# Patient Record
Sex: Female | Born: 2000 | Race: Black or African American | Hispanic: No | Marital: Single | Smoking: Current every day smoker
Health system: Southern US, Community
[De-identification: ages and names within clinical notes are randomized; demographics above are authoritative.]

## PROBLEM LIST (undated history)

## (undated) DIAGNOSIS — Z789 Other specified health status: Secondary | ICD-10-CM

## (undated) HISTORY — DX: Other specified health status: Z78.9

## (undated) HISTORY — PX: NO PAST SURGERIES: SHX2092

---
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
## 2018-01-10 ENCOUNTER — Encounter (HOSPITAL_COMMUNITY): Payer: Self-pay | Admitting: Emergency Medicine

## 2018-01-10 ENCOUNTER — Other Ambulatory Visit: Payer: Self-pay

## 2018-01-10 ENCOUNTER — Ambulatory Visit (HOSPITAL_COMMUNITY)
Admission: EM | Admit: 2018-01-10 | Discharge: 2018-01-10 | Disposition: A | Discharge status: Home / Self Care | Payer: Medicaid Other | Attending: Family Medicine | Admitting: Family Medicine

## 2018-01-10 DIAGNOSIS — Z79899 Other long term (current) drug therapy: Secondary | ICD-10-CM | POA: Diagnosis not present

## 2018-01-10 DIAGNOSIS — F17210 Nicotine dependence, cigarettes, uncomplicated: Secondary | ICD-10-CM | POA: Insufficient documentation

## 2018-01-10 DIAGNOSIS — N926 Irregular menstruation, unspecified: Secondary | ICD-10-CM | POA: Diagnosis present

## 2018-01-10 LAB — POCT URINALYSIS DIP (DEVICE)
BILIRUBIN URINE: NEGATIVE
GLUCOSE, UA: NEGATIVE mg/dL
KETONES UR: NEGATIVE mg/dL
Nitrite: NEGATIVE
Protein, ur: NEGATIVE mg/dL
Specific Gravity, Urine: 1.025 (ref 1.005–1.030)
Urobilinogen, UA: 0.2 mg/dL (ref 0.0–1.0)
pH: 6 (ref 5.0–8.0)

## 2018-01-10 LAB — POCT PREGNANCY, URINE: Preg Test, Ur: NEGATIVE

## 2018-01-10 NOTE — ED Provider Notes (Signed)
[facility]    CSN: [id number] Arrival date & time: 01/10/18  1023     History   Chief Complaint Chief Complaint  Patient presents with  . Possible Pregnancy    HPI Sarah Bell is a 17 y.o. female.   17 year old female comes in for possible pregnancy.  States that for the past week she has had clear discharge without vaginal itching or pain.  2 days ago she started spotting.  States this is inconsistent with how her cycles are, as she usually has heavy menstrual bleeding.  Therefore, she is worried about pregnancy.  She has some mild cramps to the lower abdomen without nausea or vomiting.  Denies constipation, diarrhea.  Denies fever, chills, night sweats.  Denies urinary symptoms such as frequency, dysuria, hematuria. She is sexually active with one female partner, no condom use.  No other birth control use. States that she was once on oral birth control, discontinued on own halfway through a pack a month or 2 ago.      Past Medical History:  Diagnosis Date  . Medical history non-contributory     Patient Active Problem List   Diagnosis Date Noted  . Asymptomatic bacteriuria in pregnancy in second trimester 02/24/2014  . Susceptible to varicella (non-immune), currently pregnant 01/28/2014  . Supervision of normal first teen pregnancy 01/27/2014    Past Surgical History:  Procedure Laterality Date  . NO PAST SURGERIES      OB History    Gravida Para Term Preterm AB Living   1             SAB TAB Ectopic Multiple Live Births                   Home Medications    Prior to Admission medications   Medication Sig Start Date End Date Taking? Authorizing Provider  ibuprofen (ADVIL,MOTRIN) 600 MG tablet Take 1 tablet (600 mg total) by mouth every 6 (six) hours as needed for mild pain or moderate pain. 04/21/17   Ronnell FreshwaterPatterson, Mallory Honeycutt, NP  ondansetron (ZOFRAN ODT) 4 MG disintegrating tablet Take 1 tablet (4 mg total) by mouth every 8 (eight) hours as  needed for nausea or vomiting. 10/18/16   Gwyneth SproutPlunkett, Whitney, MD  Prenatal Vit-Fe Fumarate-FA (PRENATAL VITAMIN) 27-0.8 MG TABS Take 1 tablet by mouth daily. 02/24/14   Cheral MarkerBooker, Kimberly R, CNM    Family History Family History  Problem Relation Age of Onset  . Hypertension Mother   . Cancer Paternal Aunt        pancreatic  . Diabetes Paternal Grandmother   . Hypertension Paternal Grandmother   . Stroke Paternal Grandfather   . Cancer Other        breast-paternal great grandma  . Kidney disease Other        paternal great grandma    Social History Social History   Tobacco Use  . Smoking status: Current Every Day Smoker    Types: Cigarettes  . Smokeless tobacco: Never Used  Substance Use Topics  . Alcohol use: No  . Drug use: No    Comment: not now     Allergies   Patient has no known allergies.   Review of Systems Review of Systems  Reason unable to perform ROS: See HPI as above.     Physical Exam Triage Vital Signs ED Triage Vitals  Enc Vitals Group     BP 01/10/18 1124 122/64     Pulse Rate 01/10/18 1124 73  Resp 01/10/18 1124 16     Temp 01/10/18 1124 98.9 F (37.2 C)     Temp Source 01/10/18 1124 Oral     SpO2 01/10/18 1124 98 %     Weight --      Height --      Head Circumference --      Peak Flow --      Pain Score 01/10/18 1126 4     Pain Loc --      Pain Edu? --      Excl. in GC? --    No data found.  Updated Vital Signs BP 122/64 (BP Location: Left Arm)   Pulse 73   Temp 98.9 F (37.2 C) (Oral)   Resp 16   LMP 12/13/2017 (Exact Date)   SpO2 98%   Physical Exam  Constitutional: She is oriented to person, place, and time. She appears well-developed and well-nourished. No distress.  HENT:  Head: Normocephalic and atraumatic.  Eyes: Conjunctivae are normal. Pupils are equal, round, and reactive to light.  Cardiovascular: Normal rate, regular rhythm and normal heart sounds. Exam reveals no gallop and no friction rub.  No murmur  heard. Pulmonary/Chest: Effort normal and breath sounds normal. She has no wheezes. She has no rales.  Abdominal: Soft. Bowel sounds are normal. She exhibits no mass. There is no tenderness. There is no rebound, no guarding and no CVA tenderness.  Neurological: She is alert and oriented to person, place, and time.  Skin: Skin is warm and dry.  Psychiatric: She has a normal mood and affect. Her behavior is normal. Judgment normal.     UC Treatments / Results  Labs (all labs ordered are listed, but only abnormal results are displayed) Labs Reviewed  POCT URINALYSIS DIP (DEVICE) - Abnormal; Notable for the following components:      Result Value   Hgb urine dipstick SMALL (*)    Leukocytes, UA SMALL (*)    All other components within normal limits  URINE CULTURE  POCT PREGNANCY, URINE  URINE CYTOLOGY ANCILLARY ONLY    EKG  EKG Interpretation None       Radiology No results found.  Procedures Procedures (including critical care time)  Medications Ordered in UC Medications - No data to display   Initial Impression / Assessment and Plan / UC Course  I have reviewed the triage vital signs and the nursing notes.  Pertinent labs & imaging results that were available during my care of the patient were reviewed by me and considered in my medical decision making (see chart for details).    Urine pregnancy negative.  Will send urine culture and cytology for further evaluation.  Patient has established OB/GYN, will have her follow-up for further evaluation of irregular cycles.  Return precautions given.  Patient expresses understanding and agrees to plan.  Final Clinical Impressions(s) / UC Diagnoses   Final diagnoses:  Irregular menstrual cycle    ED Discharge Orders    None        Belinda Fisher, PA-C 01/10/18 1152

## 2018-01-10 NOTE — ED Triage Notes (Signed)
Pt reports a clear discharge for one week and then two days ago she started "spotting".  She thought it was her period coming early, but she doesn't think that it is.  She is concerned for pregnancy.

## 2018-01-10 NOTE — Discharge Instructions (Signed)
Urine negative for pregnancy today.  As discussed, irregular cycle could be due to recent discontinuation of birth control.  However, there are other factors that could cause spotting.  Follow-up with OB/GYN for further evaluation needed. Testing sent, you will be contacted with any positive results that requires further treatment. Refrain from sexual activity and alcohol use for the next 7 days. Monitor for any worsening of symptoms, fever, abdominal pain, nausea, vomiting, to follow up for reevaluation.

## 2018-01-10 NOTE — ED Notes (Signed)
Urine specimen obtained while patient in lobby Urine in lab

## 2018-01-11 LAB — URINE CYTOLOGY ANCILLARY ONLY
Chlamydia: POSITIVE — AB
Neisseria Gonorrhea: POSITIVE — AB
TRICH (WINDOWPATH): POSITIVE — AB

## 2018-01-11 LAB — URINE CULTURE

## 2018-01-12 ENCOUNTER — Telehealth (HOSPITAL_COMMUNITY): Payer: Self-pay | Admitting: Emergency Medicine

## 2018-01-12 ENCOUNTER — Other Ambulatory Visit: Payer: Self-pay

## 2018-01-12 ENCOUNTER — Ambulatory Visit (HOSPITAL_COMMUNITY)
Admission: EM | Admit: 2018-01-12 | Discharge: 2018-01-12 | Disposition: A | Discharge status: Home / Self Care | Payer: Medicaid Other | Attending: Internal Medicine | Admitting: Internal Medicine

## 2018-01-12 ENCOUNTER — Encounter (HOSPITAL_COMMUNITY): Payer: Self-pay | Admitting: Emergency Medicine

## 2018-01-12 DIAGNOSIS — A549 Gonococcal infection, unspecified: Secondary | ICD-10-CM

## 2018-01-12 DIAGNOSIS — A749 Chlamydial infection, unspecified: Secondary | ICD-10-CM | POA: Diagnosis not present

## 2018-01-12 DIAGNOSIS — A5900 Urogenital trichomoniasis, unspecified: Secondary | ICD-10-CM | POA: Diagnosis not present

## 2018-01-12 LAB — URINE CYTOLOGY ANCILLARY ONLY: Candida vaginitis: NEGATIVE

## 2018-01-12 MED ORDER — AZITHROMYCIN 250 MG PO TABS
ORAL_TABLET | ORAL | Status: AC
  Filled 2018-01-12: qty 4

## 2018-01-12 MED ORDER — CEFTRIAXONE SODIUM 250 MG IJ SOLR
250.0000 mg | Freq: Once | INTRAMUSCULAR | Status: AC
  Administered 2018-01-12: 250 mg via INTRAMUSCULAR

## 2018-01-12 MED ORDER — AZITHROMYCIN 250 MG PO TABS
1000.0000 mg | ORAL_TABLET | Freq: Once | ORAL | Status: AC
  Administered 2018-01-12: 1000 mg via ORAL

## 2018-01-12 MED ORDER — METRONIDAZOLE 500 MG PO TABS: 500.0000 mg | ORAL_TABLET | Freq: Two times a day (BID) | ORAL | 0 refills | Status: DC

## 2018-01-12 MED ORDER — CEFTRIAXONE SODIUM 250 MG IJ SOLR
INTRAMUSCULAR | Status: AC
  Filled 2018-01-12: qty 250

## 2018-01-12 NOTE — ED Triage Notes (Signed)
[facility]. Notes from Dr. Dayton ScrapeMurray in Chart.

## 2018-01-12 NOTE — Telephone Encounter (Signed)
Patient presented for STD treatment.

## 2018-01-12 NOTE — ED Notes (Signed)
Flagyl escribed to patient's pharmacy per Dr. Rennie PlowmanMurray's note.

## 2018-09-29 ENCOUNTER — Emergency Department (HOSPITAL_COMMUNITY): Payer: Medicaid Other

## 2018-09-29 ENCOUNTER — Emergency Department (HOSPITAL_COMMUNITY)
Admission: EM | Admit: 2018-09-29 | Discharge: 2018-09-29 | Disposition: A | Discharge status: Home / Self Care | Payer: Medicaid Other | Attending: Emergency Medicine | Admitting: Emergency Medicine

## 2018-09-29 DIAGNOSIS — N939 Abnormal uterine and vaginal bleeding, unspecified: Secondary | ICD-10-CM

## 2018-09-29 DIAGNOSIS — A599 Trichomoniasis, unspecified: Secondary | ICD-10-CM | POA: Diagnosis not present

## 2018-09-29 DIAGNOSIS — F17210 Nicotine dependence, cigarettes, uncomplicated: Secondary | ICD-10-CM | POA: Diagnosis not present

## 2018-09-29 LAB — URINALYSIS, ROUTINE W REFLEX MICROSCOPIC
BILIRUBIN URINE: NEGATIVE
GLUCOSE, UA: NEGATIVE mg/dL
Ketones, ur: NEGATIVE mg/dL
Nitrite: NEGATIVE
PROTEIN: NEGATIVE mg/dL
SPECIFIC GRAVITY, URINE: 1.025 (ref 1.005–1.030)
pH: 6 (ref 5.0–8.0)

## 2018-09-29 LAB — BASIC METABOLIC PANEL
Anion gap: 7 (ref 5–15)
BUN: 9 mg/dL (ref 6–20)
CO2: 26 mmol/L (ref 22–32)
Calcium: 9.3 mg/dL (ref 8.9–10.3)
Chloride: 104 mmol/L (ref 98–111)
Creatinine, Ser: 0.89 mg/dL (ref 0.44–1.00)
GFR calc Af Amer: >60 mL/min (ref >60)
GFR calc non Af Amer: >60 mL/min (ref >60)
Glucose, Bld: 80 mg/dL (ref 70–99)
POTASSIUM: 3.7 mmol/L (ref 3.5–5.1)
SODIUM: 137 mmol/L (ref 135–145)

## 2018-09-29 LAB — CBC WITH DIFFERENTIAL/PLATELET
Abs Immature Granulocytes: 0.02 10*3/uL (ref 0.00–0.07)
BASOS ABS: 0 10*3/uL (ref 0.0–0.1)
Basophils Relative: 0 %
Eosinophils Absolute: 0.2 10*3/uL (ref 0.0–0.5)
Eosinophils Relative: 3 %
HCT: 42.3 % (ref 36.0–46.0)
HEMOGLOBIN: 13.6 g/dL (ref 12.0–15.0)
Immature Granulocytes: 0 %
LYMPHS PCT: 61 %
Lymphs Abs: 3.8 10*3/uL (ref 0.7–4.0)
MCH: 29.2 pg (ref 26.0–34.0)
MCHC: 32.2 g/dL (ref 30.0–36.0)
MCV: 90.8 fL (ref 80.0–100.0)
Monocytes Absolute: 0.6 10*3/uL (ref 0.1–1.0)
Monocytes Relative: 10 %
NEUTROS ABS: 1.6 10*3/uL — AB (ref 1.7–7.7)
Neutrophils Relative %: 26 %
Platelets: 297 10*3/uL (ref 150–400)
RBC: 4.66 MIL/uL (ref 3.87–5.11)
RDW: 12 % (ref 11.5–15.5)
WBC: 6.2 10*3/uL (ref 4.0–10.5)
nRBC: 0 % (ref 0.0–0.2)

## 2018-09-29 LAB — WET PREP, GENITAL
CLUE CELLS WET PREP: NONE SEEN
Sperm: NONE SEEN
Yeast Wet Prep HPF POC: NONE SEEN

## 2018-09-29 LAB — POC URINE PREG, ED: Preg Test, Ur: NEGATIVE

## 2018-09-29 MED ORDER — METRONIDAZOLE 500 MG PO TABS
2000.0000 mg | ORAL_TABLET | Freq: Once | ORAL | Status: AC
  Administered 2018-09-29: 2000 mg via ORAL
  Filled 2018-09-29: qty 4

## 2018-09-29 MED ORDER — ONDANSETRON 4 MG PO TBDP
4.0000 mg | ORAL_TABLET | Freq: Once | ORAL | Status: AC
  Administered 2018-09-29: 4 mg via ORAL
  Filled 2018-09-29: qty 1

## 2018-09-29 NOTE — ED Triage Notes (Addendum)
Pt c/o vaginal bleeding x 3 days, spotting, progressing to clots; pt states it does not feel like her period; c/o abdominal cramping; LMP 11/23; denies dysuria, frequency; endorses some nausea, denies vomiting

## 2018-09-29 NOTE — ED Notes (Signed)
Discharge papers given to patient.  Patient verbalized understanding.

## 2018-09-29 NOTE — Discharge Instructions (Addendum)
You are treated today for trichomonas.  This is a one-time treatment.  You do not need further treatment.  Please do not have any unprotected sexual intercourse for 5 to 7 days while this medication is working.  Your partner will need to be treated.  Use a condom with every sexual encounter Follow up with your doctor OBGYN in regards to today's visit and your irregular vaginal bleeding. Please return to the ER for worsening symptoms, high fevers or persistent vomiting.  You have been tested for chlamydia and gonorrhea. These results will be available in approximately 3 days. You will be notified if they are positive.   It is very important to practice safe sex and use condoms when sexually active. If your results are positive you need to notify all sexual partners so they can be treated as well. The website https://garcia.net/http://www.dontspreadit.com/ can be used to send anonymous text messages or emails to alert sexual contacts.   SEEK IMMEDIATE MEDICAL CARE IF:  You develop an oral temperature above 102 F (38.9 C), not controlled by medications or lasting more than 2 days.  You develop an increase in pain.  You develop vaginal bleeding and it is not time for your period.  You develop painful intercourse.

## 2018-09-29 NOTE — ED Notes (Signed)
Pelvic cart setup bedside. 

## 2018-09-30 NOTE — ED Provider Notes (Signed)
[facility] EMERGENCY DEPARTMENT Provider Note   CSN: [id number] Arrival date & time: 09/29/18  1240     History   Chief Complaint No chief complaint on file.   HPI Sarah Bell is a 17 y.o. female.  HPI  Patient is a 17 year old female with no significant past medical history presenting for intermenstrual vaginal bleeding and cramping.  Patient reports that she had a last menstrual period around 09-12-2018, and began again spotting 3 days ago.  She reports this is progressed to clots today.  She denies soaking through pads rapidly.  She reports that she changes her pad every time she goes the bathroom, so she does not know how many she has soaked through.  Patient also reports a small amount of vaginal discharge slightly increased above her baseline prior to the onset of bleeding.  Patient reports that she feels intermittent lower abdominal cramping without focal nature that radiates into the back.  Patient denies dysuria, urgency, or frequency.  Patient is sexually active with one female partner within the last 2 months.  Patient denies concerns about STI exposure.  Patient is not using contraception or protection, denies trying to become pregnant.  Past Medical History:  Diagnosis Date  . Medical history non-contributory     Patient Active Problem List   Diagnosis Date Noted  . Asymptomatic bacteriuria in pregnancy in second trimester 02/24/2014  . Susceptible to varicella (non-immune), currently pregnant 01/28/2014  . Supervision of normal first teen pregnancy 01/27/2014    Past Surgical History:  Procedure Laterality Date  . NO PAST SURGERIES       OB History    Gravida  1   Para      Term      Preterm      AB      Living        SAB      TAB      Ectopic      Multiple      Live Births               Home Medications    Prior to Admission medications   Medication Sig Start Date End Date Taking? Authorizing Provider    ibuprofen (ADVIL,MOTRIN) 600 MG tablet Take 1 tablet (600 mg total) by mouth every 6 (six) hours as needed for mild pain or moderate pain. Patient not taking: Reported on 09/29/2018 04/21/17   Ronnell Freshwater, NP  metroNIDAZOLE (FLAGYL) 500 MG tablet Take 1 tablet (500 mg total) by mouth 2 (two) times daily. Patient not taking: Reported on 09/29/2018 01/12/18   Isa Rankin, MD  ondansetron (ZOFRAN ODT) 4 MG disintegrating tablet Take 1 tablet (4 mg total) by mouth every 8 (eight) hours as needed for nausea or vomiting. Patient not taking: Reported on 09/29/2018 10/18/16   Gwyneth Sprout, MD  Prenatal Vit-Fe Fumarate-FA (PRENATAL VITAMIN) 27-0.8 MG TABS Take 1 tablet by mouth daily. Patient not taking: Reported on 09/29/2018 02/24/14   Cheral Marker, CNM    Family History Family History  Problem Relation Age of Onset  . Hypertension Mother   . Cancer Paternal Aunt        pancreatic  . Diabetes Paternal Grandmother   . Hypertension Paternal Grandmother   . Stroke Paternal Grandfather   . Cancer Other        breast-paternal great grandma  . Kidney disease Other        paternal great grandma    Social  History Social History   Tobacco Use  . Smoking status: Current Every Day Smoker    Types: Cigarettes  . Smokeless tobacco: Never Used  Substance Use Topics  . Alcohol use: No  . Drug use: No    Types: Marijuana    Comment: not now     Allergies   Patient has no known allergies.   Review of Systems Review of Systems  Constitutional: Negative for chills and fever.  HENT: Negative for congestion and sore throat.   Eyes: Negative for visual disturbance.  Respiratory: Negative for cough, chest tightness and shortness of breath.   Cardiovascular: Negative for chest pain, palpitations and leg swelling.  Gastrointestinal: Negative for abdominal pain, nausea and vomiting.  Genitourinary: Positive for pelvic pain, vaginal bleeding and vaginal discharge.  Negative for dysuria, flank pain and frequency.  Musculoskeletal: Negative for back pain and myalgias.  Skin: Negative for rash.  Neurological: Negative for dizziness, syncope, light-headedness and headaches.     Physical Exam Updated Vital Signs BP 130/71   Pulse 62   Temp 99.4 F (37.4 C) (Oral)   Resp 16   Ht 5\' 2"  (1.575 m)   Wt 85.7 kg   SpO2 100%   BMI 34.57 kg/m   Physical Exam  Constitutional: She appears well-developed and well-nourished. No distress.  HENT:  Head: Normocephalic and atraumatic.  Mouth/Throat: Oropharynx is clear and moist.  Eyes: Pupils are equal, round, and reactive to light. Conjunctivae and EOM are normal.  Neck: Normal range of motion. Neck supple.  Cardiovascular: Normal rate, regular rhythm, S1 normal and S2 normal.  No murmur heard. Pulmonary/Chest: Effort normal and breath sounds normal. She has no wheezes. She has no rales.  Abdominal: Soft. She exhibits no distension. There is no tenderness. There is no guarding.  Genitourinary:  Genitourinary Comments: Examination performed with EMT chaperone present.  Patient has no lymphadenopathy vagina or perineum.  Vaginal tissue is pink and rugated.  Cervix is not erythematous and nonfriable.  Few clots present within the vaginal vault.  No vaginal discharge appreciable around cervical os.  On bimanual exam, patient exhibits no cervical motion tenderness, tenderness to uterine fundus, nor adnexal tenderness.  Musculoskeletal: Normal range of motion. She exhibits no edema or deformity.  Neurological: She is alert.  Cranial nerves grossly intact. Patient moves extremities symmetrically and with good coordination.  Skin: Skin is warm and dry. No rash noted. No erythema.  Psychiatric: She has a normal mood and affect. Her behavior is normal. Judgment and thought content normal.  Nursing note and vitals reviewed.    ED Treatments / Results  Labs (all labs ordered are listed, but only abnormal results  are displayed) Labs Reviewed  WET PREP, GENITAL - Abnormal; Notable for the following components:      Result Value   Trich, Wet Prep PRESENT (*)    WBC, Wet Prep HPF POC FEW (*)    All other components within normal limits  CBC WITH DIFFERENTIAL/PLATELET - Abnormal; Notable for the following components:   Neutro Abs 1.6 (*)    All other components within normal limits  URINALYSIS, ROUTINE W REFLEX MICROSCOPIC - Abnormal; Notable for the following components:   Hgb urine dipstick LARGE (*)    Leukocytes, UA TRACE (*)    Bacteria, UA RARE (*)    All other components within normal limits  BASIC METABOLIC PANEL  POC URINE PREG, ED  GC/CHLAMYDIA PROBE AMP ([place]) [facility]    EKG None  Radiology US Transvaginal Non-ob  Result Date: 09/29/2018 CLINICAL DATA:  Pelvic pain and vaginal bleeding for 3 days. EXAM: TRANSABDOMINAL AND TRANSVAGINAL ULTRASOUND OF PELVIS DOPPLER ULTRASOUND OF OVARIES TECHNIQUE: Both transabdominal and transvaginal ultrasound examinations of the pelvis were performed. Transabdominal technique was performed for global imaging of the pelvis including uterus, ovaries, adnexal regions, and pelvic cul-de-sac. It was necessary to proceed with endovaginal exam following the transabdominal exam to visualize the endometrium and ovaries. Color and duplex Doppler ultrasound was utilized to evaluate blood flow to the ovaries. COMPARISON:  None. FINDINGS: Uterus Measurements: 8.6 x 4.0 x 3.7 cm = volume: 66 mL. No fibroids or other mass visualized. Endometrium Thickness: 6 mm.  No focal abnormality visualized. Right ovary Measurements: 2.5 x 1.7 x 2.1 cm = volume: 4.8 mL. Normal appearance/no adnexal mass. Left ovary Measurements: 6.0 x 4.9 x 5.2 cm = volume: 79.8 mL. A simple cyst is seen which measures 5.3 x 4.4 x 4.5 cm, and has benign characteristics. Pulsed Doppler evaluation of both ovaries demonstrates normal low-resistance arterial and venous waveforms. Other findings  No abnormal free fluid. IMPRESSION: 5.3 cm benign-appearing left ovarian cyst. No imaging follow up is required for premenopausal females. This follows consensus guidelines: Simple Adnexal Cysts: SRU Consensus Conference Update on Follow-up and Reporting. Radiology 2019; [id number]. Normal appearance of uterus and right ovary. No sonographic evidence for ovarian torsion. Electronically Signed   By: Myles Rosenthal M.D.   On: 09/29/2018 17:23   US Pelvis Complete  Result Date: 09/29/2018 CLINICAL DATA:  Pelvic pain and vaginal bleeding for 3 days. EXAM: TRANSABDOMINAL AND TRANSVAGINAL ULTRASOUND OF PELVIS DOPPLER ULTRASOUND OF OVARIES TECHNIQUE: Both transabdominal and transvaginal ultrasound examinations of the pelvis were performed. Transabdominal technique was performed for global imaging of the pelvis including uterus, ovaries, adnexal regions, and pelvic cul-de-sac. It was necessary to proceed with endovaginal exam following the transabdominal exam to visualize the endometrium and ovaries. Color and duplex Doppler ultrasound was utilized to evaluate blood flow to the ovaries. COMPARISON:  None. FINDINGS: Uterus Measurements: 8.6 x 4.0 x 3.7 cm = volume: 66 mL. No fibroids or other mass visualized. Endometrium Thickness: 6 mm.  No focal abnormality visualized. Right ovary Measurements: 2.5 x 1.7 x 2.1 cm = volume: 4.8 mL. Normal appearance/no adnexal mass. Left ovary Measurements: 6.0 x 4.9 x 5.2 cm = volume: 79.8 mL. A simple cyst is seen which measures 5.3 x 4.4 x 4.5 cm, and has benign characteristics. Pulsed Doppler evaluation of both ovaries demonstrates normal low-resistance arterial and venous waveforms. Other findings No abnormal free fluid. IMPRESSION: 5.3 cm benign-appearing left ovarian cyst. No imaging follow up is required for premenopausal females. This follows consensus guidelines: Simple Adnexal Cysts: SRU Consensus Conference Update on Follow-up and Reporting. Radiology 2019; [id number]. Normal  appearance of uterus and right ovary. No sonographic evidence for ovarian torsion. Electronically Signed   By: Myles Rosenthal M.D.   On: 09/29/2018 17:23   [place] Art/ven Flow Abd Pelv Doppler  Result Date: 09/29/2018 CLINICAL DATA:  Pelvic pain and vaginal bleeding for 3 days. EXAM: TRANSABDOMINAL AND TRANSVAGINAL ULTRASOUND OF PELVIS DOPPLER ULTRASOUND OF OVARIES TECHNIQUE: Both transabdominal and transvaginal ultrasound examinations of the pelvis were performed. Transabdominal technique was performed for global imaging of the pelvis including uterus, ovaries, adnexal regions, and pelvic cul-de-sac. It was necessary to proceed with endovaginal exam following the transabdominal exam to visualize the endometrium and ovaries. Color and duplex Doppler ultrasound was utilized to evaluate blood flow to the ovaries. COMPARISON:  None. FINDINGS: Uterus Measurements: 8.6 x 4.0 x 3.7 cm = volume: 66 mL. No fibroids or other mass visualized. Endometrium Thickness: 6 mm.  No focal abnormality visualized. Right ovary Measurements: 2.5 x 1.7 x 2.1 cm = volume: 4.8 mL. Normal appearance/no adnexal mass. Left ovary Measurements: 6.0 x 4.9 x 5.2 cm = volume: 79.8 mL. A simple cyst is seen which measures 5.3 x 4.4 x 4.5 cm, and has benign characteristics. Pulsed Doppler evaluation of both ovaries demonstrates normal low-resistance arterial and venous waveforms. Other findings No abnormal free fluid. IMPRESSION: 5.3 cm benign-appearing left ovarian cyst. No imaging follow up is required for premenopausal females. This follows consensus guidelines: Simple Adnexal Cysts: SRU Consensus Conference Update on Follow-up and Reporting. Radiology 2019; [id number]. Normal appearance of uterus and right ovary. No sonographic evidence for ovarian torsion. Electronically Signed   By: Myles Rosenthal M.D.   On: 09/29/2018 17:23    Procedures Procedures (including critical care time)  Medications Ordered in ED Medications  metroNIDAZOLE (FLAGYL)  tablet 2,000 mg (2,000 mg Oral Given 09/29/18 1818)  ondansetron (ZOFRAN-ODT) disintegrating tablet 4 mg (4 mg Oral Given 09/29/18 1819)     Initial Impression / Assessment and Plan / ED Course  I have reviewed the triage vital signs and the nursing notes.  Pertinent labs & imaging results that were available during my care of the patient were reviewed by me and considered in my medical decision making (see chart for details).     Patient is nontoxic-appearing, afebrile, with benign abdomen.  Pelvic examination not concerning for PID.  Patient not having abdominal tenderness during emergency department course.  Doubt PID.  Differential diagnosis includes abnormal uterine bleeding, ectopic pregnancy, spontaneous abortion.  Patient is not pregnant.  Os is closed on exam.  Do not suspect miscarriage.  Patient's blood type is B+, therefore patient had recent miscarriage, patient does not need RhoGam.  Pelvic ultrasound demonstrates no intrauterine abnormalities.  It demonstrates a left ovarian cyst, for which no follow-up is recommended.  Wet prep demonstrates trichomonas and few WBCs.  Based on pelvic exam, do not suspect further infection.  Will treat for trichomonas.  Patient instructed to follow-up with OB/GYN for abnormal uterine bleeding.  Return precautions were given for any rapid increase in bleeding, pelvic pain, or symptoms of fever or chills.  Patient is in understanding and agrees with the plan of care.  Final Clinical Impressions(s) / ED Diagnoses   Final diagnoses:  Vaginal bleeding  Trichomonas infection    ED Discharge Orders    None       Delia Chimes 09/30/18 0021    Tegeler, Canary Brim, MD 09/30/18 [phone]

## 2018-10-01 LAB — GC/CHLAMYDIA PROBE AMP (~~LOC~~) NOT AT ARMC
CHLAMYDIA, DNA PROBE: NEGATIVE
NEISSERIA GONORRHEA: NEGATIVE

## 2019-06-24 ENCOUNTER — Ambulatory Visit (HOSPITAL_COMMUNITY)
Admission: EM | Admit: 2019-06-24 | Discharge: 2019-06-24 | Disposition: A | Discharge status: Home / Self Care | Payer: Medicaid Other | Attending: Internal Medicine | Admitting: Internal Medicine

## 2019-06-24 ENCOUNTER — Encounter (HOSPITAL_COMMUNITY): Payer: Self-pay | Admitting: Emergency Medicine

## 2019-06-24 ENCOUNTER — Other Ambulatory Visit: Payer: Self-pay

## 2019-06-24 DIAGNOSIS — Z3202 Encounter for pregnancy test, result negative: Secondary | ICD-10-CM | POA: Diagnosis not present

## 2019-06-24 DIAGNOSIS — N926 Irregular menstruation, unspecified: Secondary | ICD-10-CM | POA: Insufficient documentation

## 2019-06-24 DIAGNOSIS — Z113 Encounter for screening for infections with a predominantly sexual mode of transmission: Secondary | ICD-10-CM | POA: Diagnosis not present

## 2019-06-24 LAB — POCT URINALYSIS DIP (DEVICE)
Glucose, UA: NEGATIVE mg/dL
Hgb urine dipstick: NEGATIVE
Ketones, ur: NEGATIVE mg/dL
Leukocytes,Ua: NEGATIVE
Nitrite: NEGATIVE
Protein, ur: NEGATIVE mg/dL
Specific Gravity, Urine: >=1.03 (ref 1.005–1.030)
Urobilinogen, UA: 1 mg/dL (ref 0.0–1.0)
pH: 7 (ref 5.0–8.0)

## 2019-06-24 LAB — POCT PREGNANCY, URINE: Preg Test, Ur: NEGATIVE

## 2019-06-24 NOTE — ED Provider Notes (Signed)
[facility]    CSN: [id number] Arrival date & time: 06/24/19  0831      History   Chief Complaint Chief Complaint  Patient presents with  . Menstrual Problem    HPI Sarah Bell is a 18 y.o. female.   Patient is a 18 year old female with no significant past medical history.  She presents today for irregular vaginal bleeding, spotting.  Reporting that typically her menstrual cycles come towards the end of every month but this last menstrual started on D 17th which was early.  She says it was approximately 3 days of spotting which is abnormal for her.  She is also had increase in vaginal discharge.  Denies any vaginal odor, itching or irritation.  No dysuria, hematuria or urinary frequency.  She is not currently using any birth control and is currently sexually active unprotected.  Treated for trichomonas back in March.  No abdominal pain, back pain, fevers.  ROS per HPI      Past Medical History:  Diagnosis Date  . Medical history non-contributory     Patient Active Problem List   Diagnosis Date Noted  . Asymptomatic bacteriuria in pregnancy in second trimester 02/24/2014  . Susceptible to varicella (non-immune), currently pregnant 01/28/2014  . Supervision of normal first teen pregnancy 01/27/2014    Past Surgical History:  Procedure Laterality Date  . NO PAST SURGERIES      OB History    Gravida  1   Para      Term      Preterm      AB      Living        SAB      TAB      Ectopic      Multiple      Live Births               Home Medications    Prior to Admission medications   Not on File    Family History Family History  Problem Relation Age of Onset  . Hypertension Mother   . Cancer Paternal Aunt        pancreatic  . Diabetes Paternal Grandmother   . Hypertension Paternal Grandmother   . Stroke Paternal Grandfather   . Cancer Other        breast-paternal great grandma  . Kidney disease Other        paternal  great grandma    Social History Social History   Tobacco Use  . Smoking status: Current Every Day Smoker    Types: Cigarettes  . Smokeless tobacco: Never Used  Substance Use Topics  . Alcohol use: No  . Drug use: No    Types: Marijuana    Comment: not now     Allergies   Patient has no known allergies.   Review of Systems Review of Systems   Physical Exam Triage Vital Signs ED Triage Vitals  Enc Vitals Group     BP 06/24/19 0918 131/79     Pulse Rate 06/24/19 0918 88     Resp 06/24/19 0918 18     Temp 06/24/19 0918 98.4 F (36.9 C)     Temp Source 06/24/19 0918 Oral     SpO2 06/24/19 0918 96 %     Weight --      Height --      Head Circumference --      Peak Flow --      Pain Score 06/24/19 0915 0  Pain Loc --      Pain Edu? --      Excl. in [place]? --    No data found.  Updated Vital Signs BP 131/79 (BP Location: Right Arm)   Pulse 88   Temp 98.4 F (36.9 C) (Oral)   Resp 18   LMP 06/10/2019   SpO2 96%   Visual Acuity Right Eye Distance:   Left Eye Distance:   Bilateral Distance:    Right Eye Near:   Left Eye Near:    Bilateral Near:     Physical Exam Vitals signs and nursing note reviewed.  Constitutional:      General: She is not in acute distress.    Appearance: Normal appearance. She is not ill-appearing, toxic-appearing or diaphoretic.  HENT:     Head: Normocephalic.     Nose: Nose normal.     Mouth/Throat:     Pharynx: Oropharynx is clear.  Eyes:     Conjunctiva/sclera: Conjunctivae normal.  Neck:     Musculoskeletal: Normal range of motion.  Pulmonary:     Effort: Pulmonary effort is normal.  Abdominal:     Palpations: Abdomen is soft.     Tenderness: There is no abdominal tenderness.  Musculoskeletal: Normal range of motion.  Skin:    General: Skin is warm and dry.     Findings: No rash.  Neurological:     Mental Status: She is alert.  Psychiatric:        Mood and Affect: Mood normal.      UC Treatments / Results   Labs (all labs ordered are listed, but only abnormal results are displayed) Labs Reviewed  POCT URINALYSIS DIP (DEVICE) - Abnormal; Notable for the following components:      Result Value   Bilirubin Urine SMALL (*)    All other components within normal limits  POCT PREGNANCY, URINE  CERVICOVAGINAL ANCILLARY ONLY    EKG   Radiology No results found.  Procedures Procedures (including critical care time)  Medications Ordered in UC Medications - No data to display  Initial Impression / Assessment and Plan / UC Course  I have reviewed the triage vital signs and the nursing notes.  Pertinent labs & imaging results that were available during my care of the patient were reviewed by me and considered in my medical decision making (see chart for details).     Urine negative for pregnancy and infection. Sending swab for STD testing with labs pending. Recommended follow-up with OB/GYN for further management of her menstrual cycles. Final Clinical Impressions(s) / UC Diagnoses   Final diagnoses:  Irregular menstrual cycle     Discharge Instructions     Your pregnancy test was negative Urine did not show any infection. We will send the swab for testing and call you with any positive results. Recommend following up with OB/GYN for regulation of [place] cycles.    ED Prescriptions    None     Controlled Substance Prescriptions [place] Controlled Substance Registry consulted? Not Applicable   Orvan July, NP 06/24/19 [phone]

## 2019-06-24 NOTE — ED Triage Notes (Addendum)
Complains of irregular periods.  Last month started a week early.   Patient has not been on birth control of any kind for the last 2 years.  Started having periods again last year after stopping depo and a brief round of bcp.    denies pain, but later, admits to crampy feeling with spotty period she had in august

## 2019-06-24 NOTE — Discharge Instructions (Addendum)
Your pregnancy test was negative Urine did not show any infection. We will send the swab for testing and call you with any positive results. Recommend following up with OB/GYN for regulation of Mitchell cycles.

## 2019-06-24 NOTE — ED Notes (Signed)
Sent patient for specimen

## 2019-06-24 NOTE — ED Notes (Signed)
Urine in lab 

## 2019-06-26 ENCOUNTER — Encounter (HOSPITAL_COMMUNITY): Payer: Self-pay

## 2019-06-26 ENCOUNTER — Telehealth (HOSPITAL_COMMUNITY): Payer: Self-pay | Admitting: Emergency Medicine

## 2019-06-26 LAB — CERVICOVAGINAL ANCILLARY ONLY
Bacterial vaginitis: POSITIVE — AB
Candida vaginitis: NEGATIVE
Chlamydia: POSITIVE — AB
Neisseria Gonorrhea: NEGATIVE
Trichomonas: NEGATIVE

## 2019-06-26 MED ORDER — AZITHROMYCIN 250 MG PO TABS: 1000.0000 mg | ORAL_TABLET | Freq: Once | ORAL | 0 refills | Status: AC

## 2019-06-26 MED ORDER — METRONIDAZOLE 500 MG PO TABS: 500.0000 mg | ORAL_TABLET | Freq: Two times a day (BID) | ORAL | 0 refills | Status: AC

## 2019-06-26 NOTE — Telephone Encounter (Signed)
Chlamydia is positive.  Rx po zithromax 1g #1 dose no refills was sent to the pharmacy of record.  Pt needs education to please refrain from sexual intercourse for 7 days to give the medicine time to work, sexual partners need to be notified and tested/treated.  Condoms may reduce risk of reinfection.  Recheck or followup with PCP for further evaluation if symptoms are not improving.   [facility] notified.  Bacterial vaginosis is positive. This was not treated at the urgent care visit.  Flagyl 500 mg BID x 7 days #14 no refills sent to patients pharmacy of choice.    Attempted to reach patient. No answer at this time. Call cannot be completed. Mychart message sent.

## 2019-06-27 ENCOUNTER — Telehealth (HOSPITAL_COMMUNITY): Payer: Self-pay | Admitting: Emergency Medicine

## 2019-06-27 NOTE — Telephone Encounter (Signed)
Attempted to reach patient x2. No answer at this time. Call cannot be completed. Will send letter, try once more.

## 2019-07-01 ENCOUNTER — Telehealth (HOSPITAL_COMMUNITY): Payer: Self-pay | Admitting: Emergency Medicine

## 2019-07-01 ENCOUNTER — Encounter (HOSPITAL_COMMUNITY): Payer: Self-pay

## 2019-07-01 NOTE — Telephone Encounter (Signed)
Attempted to reach patient x3. No answer at this time.Call cannot be completed. Letter already sent, however pt did read mychart message. Sent her another message to confirm she picked up the medication. Will close out chart.

## 2019-10-15 ENCOUNTER — Encounter (HOSPITAL_COMMUNITY): Payer: Self-pay

## 2019-10-15 ENCOUNTER — Ambulatory Visit (HOSPITAL_COMMUNITY)
Admission: EM | Admit: 2019-10-15 | Discharge: 2019-10-15 | Disposition: A | Discharge status: Home / Self Care | Payer: Medicaid Other | Attending: Family Medicine | Admitting: Family Medicine

## 2019-10-15 ENCOUNTER — Other Ambulatory Visit: Payer: Self-pay

## 2019-10-15 DIAGNOSIS — Z113 Encounter for screening for infections with a predominantly sexual mode of transmission: Secondary | ICD-10-CM | POA: Insufficient documentation

## 2019-10-15 LAB — POCT URINALYSIS DIP (DEVICE)
Glucose, UA: NEGATIVE mg/dL
Hgb urine dipstick: NEGATIVE
Leukocytes,Ua: NEGATIVE
Nitrite: NEGATIVE
Protein, ur: NEGATIVE mg/dL
Specific Gravity, Urine: >=1.03 (ref 1.005–1.030)
Urobilinogen, UA: 0.2 mg/dL (ref 0.0–1.0)
pH: 6 (ref 5.0–8.0)

## 2019-10-15 LAB — RAPID HIV SCREEN (HIV 1/2 AB+AG)
HIV 1/2 Antibodies: NONREACTIVE
HIV-1 P24 Antigen - HIV24: NONREACTIVE

## 2019-10-15 NOTE — Discharge Instructions (Addendum)
Screening you for STDs We will call with any positive results.
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
## 2019-10-16 LAB — RPR: RPR Ser Ql: NONREACTIVE

## 2019-10-16 NOTE — ED Provider Notes (Signed)
[facility]    CSN: [id number] Arrival date & time: 10/15/19  1207      History   Chief Complaint Chief Complaint  Patient presents with  . S.74    HPI Sarah Bell is a 18 y.o. female.   Patient is a 18 year old female the presents today for STD screening.  Reporting new partner and would like to be screened.  Currently denies any symptoms.Patient's last menstrual period was 10/06/2019.      Past Medical History:  Diagnosis Date  . Medical history non-contributory     Patient Active Problem List   Diagnosis Date Noted  . Asymptomatic bacteriuria in pregnancy in second trimester 02/24/2014  . Susceptible to varicella (non-immune), currently pregnant 01/28/2014  . Supervision of normal first teen pregnancy 01/27/2014    Past Surgical History:  Procedure Laterality Date  . NO PAST SURGERIES      OB History    Gravida  1   Para      Term      Preterm      AB      Living        SAB      TAB      Ectopic      Multiple      Live Births               Home Medications    Prior to Admission medications   Not on File    Family History Family History  Problem Relation Age of Onset  . Hypertension Mother   . Cancer Paternal Aunt        pancreatic  . Diabetes Paternal Grandmother   . Hypertension Paternal Grandmother   . Stroke Paternal Grandfather   . Cancer Other        breast-paternal great grandma  . Kidney disease Other        paternal great grandma  . Healthy Father     Social History Social History   Tobacco Use  . Smoking status: Current Every Day Smoker    Types: Cigarettes  . Smokeless tobacco: Never Used  Substance Use Topics  . Alcohol use: No  . Drug use: No    Types: Marijuana    Comment: not now     Allergies   Patient has no known allergies.   Review of Systems Review of Systems  Genitourinary: Negative for decreased urine volume, difficulty urinating, dyspareunia, dysuria, enuresis,  flank pain, frequency, genital sores, hematuria, menstrual problem, pelvic pain, urgency, vaginal bleeding, vaginal discharge and vaginal pain.     Physical Exam Triage Vital Signs ED Triage Vitals [10/15/19 1237]  Enc Vitals Group     BP 134/83     Pulse Rate 74     Resp 18     Temp 98.5 F (36.9 C)     Temp Source Oral     SpO2 92 %     Weight 176 lb 9.6 oz (80.1 kg)     Height      Head Circumference      Peak Flow      Pain Score 0     Pain Loc      Pain Edu?      Excl. in [place]?    No data found.  Updated Vital Signs BP 134/83 (BP Location: Right Arm)   Pulse 74   Temp 98.5 F (36.9 C) (Oral)   Resp 18   Wt 176 lb 9.6 oz (80.1 kg)  LMP 10/06/2019   SpO2 92%   BMI 32.30 kg/m   Visual Acuity Right Eye Distance:   Left Eye Distance:   Bilateral Distance:    Right Eye Near:   Left Eye Near:    Bilateral Near:     Physical Exam Vitals and nursing note reviewed.  Constitutional:      General: She is not in acute distress.    Appearance: Normal appearance. She is not ill-appearing, toxic-appearing or diaphoretic.  HENT:     Head: Normocephalic.     Nose: Nose normal.     Mouth/Throat:     Pharynx: Oropharynx is clear.  Eyes:     Conjunctiva/sclera: Conjunctivae normal.  Pulmonary:     Effort: Pulmonary effort is normal.  Abdominal:     Palpations: Abdomen is soft.     Tenderness: There is no abdominal tenderness.  Musculoskeletal:        General: Normal range of motion.     Cervical back: Normal range of motion.  Skin:    General: Skin is warm and dry.     Findings: No rash.  Neurological:     Mental Status: She is alert.  Psychiatric:        Mood and Affect: Mood normal.      UC Treatments / Results  Labs (all labs ordered are listed, but only abnormal results are displayed) Labs Reviewed  POCT URINALYSIS DIP (DEVICE) - Abnormal; Notable for the following components:      Result Value   Bilirubin Urine SMALL (*)    Ketones, ur TRACE  (*)    All other components within normal limits  RAPID HIV SCREEN (HIV 1/2 AB+AG)  RPR  CERVICOVAGINAL ANCILLARY ONLY    EKG   Radiology No results found.  Procedures Procedures (including critical care time)  Medications Ordered in UC Medications - No data to display  Initial Impression / Assessment and Plan / UC Course  I have reviewed the triage vital signs and the nursing notes.  Pertinent labs & imaging results that were available during my care of the patient were reviewed by me and considered in my medical decision making (see chart for details).     STD screening- swab sent for testing Pt not wanting treatment   Final Clinical Impressions(s) / UC Diagnoses   Final diagnoses:  Screening examination for STD (sexually transmitted disease)     Discharge Instructions     Screening you for STDs We will call with any positive results.     ED Prescriptions    None     PDMP not reviewed this encounter.   Janace Aris, NP 10/16/19 1009

## 2019-10-17 ENCOUNTER — Telehealth: Payer: Self-pay | Admitting: Emergency Medicine

## 2019-10-17 LAB — CERVICOVAGINAL ANCILLARY ONLY
Bacterial vaginitis: POSITIVE — AB
Candida vaginitis: NEGATIVE
Chlamydia: NEGATIVE
Neisseria Gonorrhea: NEGATIVE
Trichomonas: POSITIVE — AB

## 2019-10-17 MED ORDER — METRONIDAZOLE 500 MG PO TABS: 500.0000 mg | ORAL_TABLET | Freq: Two times a day (BID) | ORAL | 0 refills | Status: AC

## 2019-10-17 NOTE — Telephone Encounter (Signed)
Bacterial vaginosis is positive. This was not treated at the urgent care visit.  Flagyl 500 mg BID x 7 days #14 no refills sent to patients pharmacy of choice.    Trichomonas is positive. Rx  for Flagyl once was sent to the pharmacy of record. Pt needs education to refrain from sexual intercourse for 7 days to give the medicine time to work. Sexual partners need to be notified and tested/treated. Condoms may reduce risk of reinfection. Recheck for further evaluation if symptoms are not improving.   Patient contacted by phone and made aware of    results. Pt verbalized understanding and had all questions answered.

## 2020-01-17 ENCOUNTER — Ambulatory Visit (HOSPITAL_COMMUNITY)
Admission: EM | Admit: 2020-01-17 | Discharge: 2020-01-17 | Disposition: A | Discharge status: Home / Self Care | Payer: Medicaid Other | Attending: Urgent Care | Admitting: Urgent Care

## 2020-01-17 ENCOUNTER — Other Ambulatory Visit: Payer: Self-pay

## 2020-01-17 ENCOUNTER — Encounter (HOSPITAL_COMMUNITY): Payer: Self-pay

## 2020-01-17 DIAGNOSIS — Z202 Contact with and (suspected) exposure to infections with a predominantly sexual mode of transmission: Secondary | ICD-10-CM

## 2020-01-17 DIAGNOSIS — Z3202 Encounter for pregnancy test, result negative: Secondary | ICD-10-CM | POA: Diagnosis not present

## 2020-01-17 DIAGNOSIS — N898 Other specified noninflammatory disorders of vagina: Secondary | ICD-10-CM | POA: Insufficient documentation

## 2020-01-17 LAB — POCT PREGNANCY, URINE: Preg Test, Ur: NEGATIVE

## 2020-01-17 LAB — POC URINE PREG, ED: Preg Test, Ur: NEGATIVE

## 2020-01-17 MED ORDER — AZITHROMYCIN 250 MG PO TABS
ORAL_TABLET | ORAL | Status: AC
  Filled 2020-01-17: qty 4

## 2020-01-17 MED ORDER — CEFTRIAXONE SODIUM 500 MG IJ SOLR
500.0000 mg | Freq: Once | INTRAMUSCULAR | Status: AC
  Administered 2020-01-17: 500 mg via INTRAMUSCULAR

## 2020-01-17 MED ORDER — AZITHROMYCIN 250 MG PO TABS
1000.0000 mg | ORAL_TABLET | Freq: Once | ORAL | Status: AC
  Administered 2020-01-17: 1000 mg via ORAL

## 2020-01-17 MED ORDER — CEFTRIAXONE SODIUM 500 MG IJ SOLR
INTRAMUSCULAR | Status: AC
  Filled 2020-01-17: qty 500

## 2020-01-17 NOTE — ED Triage Notes (Signed)
Pt c/o yellow vaginal discharge and diarrhea for approx 1 week. Pt states her partner recommended that she "get tested" after being with another individual who possibly has an STI.  Denies abdom pain, n/v, fever, chills.

## 2020-01-17 NOTE — ED Provider Notes (Signed)
[facility]   MRN: [id number] DOB: June 21, 2001  Subjective:   Sarah Bell is a 19 y.o. female presenting for 1 week hx of persistent vaginal discharge, intermittent loose stools. Patient was told by her sex partner that she needed to get tested for STIs after they were with an individual that turned out to have an STI. Does not know which one it was.   No current facility-administered medications for this encounter. No current outpatient medications on file.   No Known Allergies  Past Medical History:  Diagnosis Date  . Medical history non-contributory      Past Surgical History:  Procedure Laterality Date  . NO PAST SURGERIES      Family History  Problem Relation Age of Onset  . Hypertension Mother   . Cancer Paternal Aunt        pancreatic  . Diabetes Paternal Grandmother   . Hypertension Paternal Grandmother   . Stroke Paternal Grandfather   . Cancer Other        breast-paternal great grandma  . Kidney disease Other        paternal great grandma  . Healthy Father     Social History   Tobacco Use  . Smoking status: Current Every Day Smoker    Types: Cigarettes  . Smokeless tobacco: Never Used  Substance Use Topics  . Alcohol use: No  . Drug use: No    Types: Marijuana    Comment: not now    Review of Systems  Constitutional: Negative for chills and fever.  Respiratory: Negative for shortness of breath.   Cardiovascular: Negative for chest pain.  Gastrointestinal: Negative for abdominal pain, nausea and vomiting.  Genitourinary: Negative for dysuria, flank pain, frequency, hematuria and urgency.  Musculoskeletal: Negative for myalgias.  Skin: Negative for rash.  Neurological: Negative for dizziness and headaches.     Objective:   Vitals: BP 128/70 (BP Location: Left Arm)   Pulse (!) 102   Temp 99.4 F (37.4 C) (Oral)   Resp 20   LMP 12/27/2019   SpO2 100%   Pulse was 86 on recheck by PA [facility].  Physical Exam Constitutional:       General: She is not in acute distress.    Appearance: Normal appearance. She is well-developed and normal weight. She is not ill-appearing, toxic-appearing or diaphoretic.  HENT:     Head: Normocephalic and atraumatic.     Right Ear: External ear normal.     Left Ear: External ear normal.     Nose: Nose normal.     Mouth/Throat:     Mouth: Mucous membranes are moist.     Pharynx: Oropharynx is clear.  Eyes:     General: No scleral icterus.    Extraocular Movements: Extraocular movements intact.     Pupils: Pupils are equal, round, and reactive to light.  Cardiovascular:     Rate and Rhythm: Normal rate and regular rhythm.     Heart sounds: Normal heart sounds. No murmur. No friction rub. No gallop.   Pulmonary:     Effort: Pulmonary effort is normal. No respiratory distress.     Breath sounds: Normal breath sounds. No stridor. No wheezing, rhonchi or rales.  Abdominal:     General: Bowel sounds are normal. There is no distension.     Palpations: Abdomen is soft. There is no mass.     Tenderness: There is no abdominal tenderness. There is no right CVA tenderness, left CVA tenderness, guarding or rebound.  Skin:    General: Skin is warm and dry.     Coloration: Skin is not pale.     Findings: No rash.  Neurological:     General: No focal deficit present.     Mental Status: She is alert and oriented to person, place, and time.  Psychiatric:        Mood and Affect: Mood normal.        Behavior: Behavior normal.        Thought Content: Thought content normal.        Judgment: Judgment normal.     Results for orders placed or performed during the hospital encounter of 01/17/20 (from the past 24 hour(s))  POC urine pregnancy     Status: None   Collection Time: 01/17/20  3:09 PM  Result Value Ref Range   Preg Test, Ur NEGATIVE NEGATIVE  Pregnancy, urine POC     Status: None   Collection Time: 01/17/20  3:09 PM  Result Value Ref Range   Preg Test, Ur NEGATIVE NEGATIVE     Assessment and Plan :   1. Vaginal discharge   2. Possible exposure to STD     Will cover for STIs empirically with ceftriaxone and azithromycin.  I am opting to do azithromycin to make sure that patient has treatment while she is here in the clinic as opposed to relying on patient to obtain doxycycline and complete a full course as an outpatient.  STI testing is pending. Counseled patient on potential for adverse effects with medications prescribed/recommended today, ER and return-to-clinic precautions discussed, patient verbalized understanding.    Wallis Bamberg, [place] 01/17/20 6378

## 2020-01-17 NOTE — Discharge Instructions (Addendum)
I am treating you today with an injection of an antibiotic called ceftriaxone and pills of azithromycin given your possible exposure to an STD.  These 2 antibiotics will cover for the most common types of infections including gonorrhea and chlamydia.  I will let you know about your lab results as they return to [place].  Avoid all forms of sexual intercourse (oral, vaginal, anal) for the next 7 days to avoid spreading/reinfecting. Return if symptoms worsen/do not resolve, you develop fever, abdominal pain, blood in your urine, or are re-exposed to an STI.

## 2020-01-21 ENCOUNTER — Telehealth (HOSPITAL_COMMUNITY): Payer: Self-pay

## 2020-01-21 LAB — CERVICOVAGINAL ANCILLARY ONLY
Bacterial vaginitis: POSITIVE — AB
Candida vaginitis: NEGATIVE
Chlamydia: NEGATIVE
Neisseria Gonorrhea: NEGATIVE
Trichomonas: NEGATIVE

## 2020-01-21 MED ORDER — METRONIDAZOLE 500 MG PO TABS: 500.0000 mg | ORAL_TABLET | Freq: Two times a day (BID) | ORAL | 0 refills | Status: DC

## 2020-03-03 ENCOUNTER — Ambulatory Visit (HOSPITAL_COMMUNITY)
Admission: EM | Admit: 2020-03-03 | Discharge: 2020-03-03 | Disposition: A | Discharge status: Home / Self Care | Payer: Medicaid Other | Attending: Family Medicine | Admitting: Family Medicine

## 2020-03-03 ENCOUNTER — Encounter (HOSPITAL_COMMUNITY): Payer: Self-pay

## 2020-03-03 ENCOUNTER — Other Ambulatory Visit: Payer: Self-pay

## 2020-03-03 DIAGNOSIS — Z711 Person with feared health complaint in whom no diagnosis is made: Secondary | ICD-10-CM | POA: Diagnosis not present

## 2020-03-03 DIAGNOSIS — N898 Other specified noninflammatory disorders of vagina: Secondary | ICD-10-CM | POA: Insufficient documentation

## 2020-03-03 LAB — POC URINE PREG, ED: Preg Test, Ur: NEGATIVE

## 2020-03-03 MED ORDER — METRONIDAZOLE 500 MG PO TABS: 500.0000 mg | ORAL_TABLET | Freq: Two times a day (BID) | ORAL | 0 refills | Status: AC

## 2020-03-03 NOTE — ED Triage Notes (Signed)
Patient reports vaginal discharge x2 days. Requesting STD testing and treatment

## 2020-03-03 NOTE — Discharge Instructions (Addendum)
Begin metronidazole twice daily for 1 week with food to treat bacterial vaginosis  We are testing you for Gonorrhea, Chlamydia, Trichomonas, Yeast and Bacterial Vaginosis. We will call you if anything is positive and let you know if you require any further treatment. Please inform partners of any positive results.   Please return if symptoms not improving with treatment, development of fever, nausea, vomiting, abdominal pain.

## 2020-03-04 ENCOUNTER — Telehealth (HOSPITAL_COMMUNITY): Payer: Self-pay

## 2020-03-04 LAB — CERVICOVAGINAL ANCILLARY ONLY
Bacterial Vaginitis (gardnerella): POSITIVE — AB
Candida Glabrata: NEGATIVE
Candida Vaginitis: POSITIVE — AB
Chlamydia: NEGATIVE
Comment: NEGATIVE
Comment: NEGATIVE
Comment: NEGATIVE
Comment: NEGATIVE
Comment: NEGATIVE
Comment: NORMAL
Neisseria Gonorrhea: NEGATIVE
Trichomonas: NEGATIVE

## 2020-03-04 MED ORDER — FLUCONAZOLE 150 MG PO TABS: 150.0000 mg | ORAL_TABLET | Freq: Every day | ORAL | 0 refills | Status: AC

## 2020-03-04 NOTE — ED Provider Notes (Signed)
[facility]    CSN: [id number] Arrival date & time: 03/03/20  1232      History   Chief Complaint Chief Complaint  Patient presents with  . Vaginal Discharge  . SEXUALLY TRANSMITTED DISEASE    HPI Sarah Bell is a 19 y.o. female no significant past medical history presenting today for evaluation of vaginal discharge and STD screening.  Patient notes that she has had discharge over the past 2 days.  She has had associated odor associated with this.  She denies associated itching or irritation.  Denies abdominal pain or pelvic pain.  Does report some urinary urgency, but denies frequency or dysuria.  Denies hematuria.  She does report recent unprotected intercourse and would like to be screened for STDs.  Denies any known exposures.  Has had history of BV.  She does report washing with scented soaps.  Last menstrual cycle was 5/01.  Is not on birth control.  HPI  Past Medical History:  Diagnosis Date  . Medical history non-contributory     Patient Active Problem List   Diagnosis Date Noted  . Asymptomatic bacteriuria in pregnancy in second trimester 02/24/2014  . Susceptible to varicella (non-immune), currently pregnant 01/28/2014  . Supervision of normal first teen pregnancy 01/27/2014    Past Surgical History:  Procedure Laterality Date  . NO PAST SURGERIES      OB History    Gravida  1   Para      Term      Preterm      AB      Living        SAB      TAB      Ectopic      Multiple      Live Births               Home Medications    Prior to Admission medications   Medication Sig Start Date End Date Taking? Authorizing Provider  metroNIDAZOLE (FLAGYL) 500 MG tablet Take 1 tablet (500 mg total) by mouth 2 (two) times daily for 7 days. 03/03/20 03/10/20  Nile Prisk, Junius Creamer, PA-C    Family History Family History  Problem Relation Age of Onset  . Hypertension Mother   . Cancer Paternal Aunt        pancreatic  . Diabetes  Paternal Grandmother   . Hypertension Paternal Grandmother   . Stroke Paternal Grandfather   . Cancer Other        breast-paternal great grandma  . Kidney disease Other        paternal great grandma  . Healthy Father     Social History Social History   Tobacco Use  . Smoking status: Current Every Day Smoker    Types: Cigarettes  . Smokeless tobacco: Never Used  Substance Use Topics  . Alcohol use: No  . Drug use: No    Types: Marijuana    Comment: not now     Allergies   Patient has no known allergies.   Review of Systems Review of Systems  Constitutional: Negative for fever.  Respiratory: Negative for shortness of breath.   Cardiovascular: Negative for chest pain.  Gastrointestinal: Negative for abdominal pain, diarrhea, nausea and vomiting.  Genitourinary: Positive for urgency and vaginal discharge. Negative for dysuria, flank pain, genital sores, hematuria, menstrual problem, vaginal bleeding and vaginal pain.  Musculoskeletal: Negative for back pain.  Skin: Negative for rash.  Neurological: Negative for dizziness, light-headedness and headaches.  Physical Exam Triage Vital Signs ED Triage Vitals  Enc Vitals Group     BP 03/03/20 1255 (!) 141/77     Pulse Rate 03/03/20 1255 73     Resp 03/03/20 1255 16     Temp 03/03/20 1255 98.7 F (37.1 C)     Temp Source 03/03/20 1255 Oral     SpO2 03/03/20 1255 100 %     Weight --      Height --      Head Circumference --      Peak Flow --      Pain Score 03/03/20 1254 0     Pain Loc --      Pain Edu? --      Excl. in [place]? --    No data found.  Updated Vital Signs BP (!) 141/77 (BP Location: Right Arm)   Pulse 73   Temp 98.7 F (37.1 C) (Oral)   Resp 16   LMP 02/22/2020 (Exact Date)   SpO2 100%   Visual Acuity Right Eye Distance:   Left Eye Distance:   Bilateral Distance:    Right Eye Near:   Left Eye Near:    Bilateral Near:     Physical Exam Vitals and nursing note reviewed.    Constitutional:      Appearance: She is well-developed.     Comments: No acute distress  HENT:     Head: Normocephalic and atraumatic.     Nose: Nose normal.  Eyes:     Conjunctiva/sclera: Conjunctivae normal.  Cardiovascular:     Rate and Rhythm: Normal rate.  Pulmonary:     Effort: Pulmonary effort is normal. No respiratory distress.  Abdominal:     General: There is no distension.     Comments: Soft, nondistended, nontender light and deep palpation throughout abdomen  Musculoskeletal:        General: Normal range of motion.     Cervical back: Neck supple.  Skin:    General: Skin is warm and dry.  Neurological:     Mental Status: She is alert and oriented to person, place, and time.      UC Treatments / Results  Labs (all labs ordered are listed, but only abnormal results are displayed) Labs Reviewed  POC URINE PREG, ED  CERVICOVAGINAL ANCILLARY ONLY    EKG   Radiology No results found.  Procedures Procedures (including critical care time)  Medications Ordered in UC Medications - No data to display  Initial Impression / Assessment and Plan / UC Course  I have reviewed the triage vital signs and the nursing notes.  Pertinent labs & imaging results that were available during my care of the patient were reviewed by me and considered in my medical decision making (see chart for details).     Pregnancy test negative, UA with negative leuks and nitrites.  Symptoms suspicious of likely BV, empirically treating for this today with metronidazole.  Recommended to defer STD treatment given she had recent testing which was negative.  Cervical rectovaginal swab pending for gonorrhea, chlamydia and trichomonas, yeast and BV.  Discussed common trigger of scented soaps as potential cause of symptoms.  Discussed strict return precautions. Patient verbalized understanding and is agreeable with plan.  Final Clinical Impressions(s) / UC Diagnoses   Final diagnoses:   Vaginal discharge  Concern about STD in female without diagnosis     Discharge Instructions     Begin metronidazole twice daily for 1 week with food to treat bacterial  vaginosis  We are testing you for Gonorrhea, Chlamydia, Trichomonas, Yeast and Bacterial Vaginosis. We will call you if anything is positive and let you know if you require any further treatment. Please inform partners of any positive results.   Please return if symptoms not improving with treatment, development of fever, nausea, vomiting, abdominal pain.    ED Prescriptions    Medication Sig Dispense Auth. Provider   metroNIDAZOLE (FLAGYL) 500 MG tablet Take 1 tablet (500 mg total) by mouth 2 (two) times daily for 7 days. 14 tablet Jevaughn Degollado, [place] C, PA-C     PDMP not reviewed this encounter.   Kyonna Frier, [place] C, PA-C 03/04/20 0830

## 2020-03-14 ENCOUNTER — Encounter (HOSPITAL_COMMUNITY): Payer: Self-pay | Admitting: Emergency Medicine

## 2020-03-14 ENCOUNTER — Other Ambulatory Visit: Payer: Self-pay

## 2020-03-14 ENCOUNTER — Emergency Department (HOSPITAL_COMMUNITY)
Admission: EM | Admit: 2020-03-14 | Discharge: 2020-03-14 | Disposition: A | Discharge status: Home / Self Care | Payer: Medicaid Other | Attending: Emergency Medicine | Admitting: Emergency Medicine

## 2020-03-14 DIAGNOSIS — Y998 Other external cause status: Secondary | ICD-10-CM | POA: Diagnosis not present

## 2020-03-14 DIAGNOSIS — F17210 Nicotine dependence, cigarettes, uncomplicated: Secondary | ICD-10-CM | POA: Insufficient documentation

## 2020-03-14 DIAGNOSIS — S86911A Strain of unspecified muscle(s) and tendon(s) at lower leg level, right leg, initial encounter: Secondary | ICD-10-CM | POA: Insufficient documentation

## 2020-03-14 DIAGNOSIS — Y929 Unspecified place or not applicable: Secondary | ICD-10-CM | POA: Insufficient documentation

## 2020-03-14 DIAGNOSIS — Y9389 Activity, other specified: Secondary | ICD-10-CM | POA: Diagnosis not present

## 2020-03-14 DIAGNOSIS — S8991XA Unspecified injury of right lower leg, initial encounter: Secondary | ICD-10-CM | POA: Diagnosis present

## 2020-03-14 DIAGNOSIS — S86811A Strain of other muscle(s) and tendon(s) at lower leg level, right leg, initial encounter: Secondary | ICD-10-CM | POA: Diagnosis not present

## 2020-03-14 DIAGNOSIS — W010XXA Fall on same level from slipping, tripping and stumbling without subsequent striking against object, initial encounter: Secondary | ICD-10-CM | POA: Insufficient documentation

## 2020-03-14 NOTE — ED Provider Notes (Signed)
[facility] EMERGENCY DEPARTMENT Provider Note   CSN: [id number] Arrival date & time: 03/14/20  1136     History Chief Complaint  Patient presents with  . Knee Pain    Sarah Bell is a 19 y.o. female.  19 year old female presents with complaint of pain in her right knee.  Patient states that early this morning she tried to kick something and missed which caused her to fall.  Patient has been ambulatory without difficulty however states she has pain in her knee and wanted to get this checked out today.  No prior knee problems.  No other complaints or concerns today.        Past Medical History:  Diagnosis Date  . Medical history non-contributory     Patient Active Problem List   Diagnosis Date Noted  . Asymptomatic bacteriuria in pregnancy in second trimester 02/24/2014  . Susceptible to varicella (non-immune), currently pregnant 01/28/2014  . Supervision of normal first teen pregnancy 01/27/2014    Past Surgical History:  Procedure Laterality Date  . NO PAST SURGERIES       OB History    Gravida  1   Para      Term      Preterm      AB      Living        SAB      TAB      Ectopic      Multiple      Live Births              Family History  Problem Relation Age of Onset  . Hypertension Mother   . Cancer Paternal Aunt        pancreatic  . Diabetes Paternal Grandmother   . Hypertension Paternal Grandmother   . Stroke Paternal Grandfather   . Cancer Other        breast-paternal great grandma  . Kidney disease Other        paternal great grandma  . Healthy Father     Social History   Tobacco Use  . Smoking status: Current Every Day Smoker    Types: Cigarettes  . Smokeless tobacco: Never Used  Substance Use Topics  . Alcohol use: No  . Drug use: No    Types: Marijuana    Comment: not now    Home Medications Prior to Admission medications   Not on File    Allergies    Patient has no known  allergies.  Review of Systems   Review of Systems  Constitutional: Negative for fever.  Musculoskeletal: Positive for arthralgias. Negative for gait problem, joint swelling and myalgias.  Skin: Negative for rash and wound.  Neurological: Negative for weakness and numbness.    Physical Exam Updated Vital Signs BP 134/75 (BP Location: Left Arm)   Pulse (!) 103   Temp 98.8 F (37.1 C) (Oral)   Resp 16   Ht 5\' 2"  (1.575 m)   Wt 89.8 kg   LMP 02/26/2020   SpO2 99%   BMI 36.21 kg/m   Physical Exam Vitals and nursing note reviewed.  Constitutional:      General: She is not in acute distress.    Appearance: She is well-developed. She is not diaphoretic.  HENT:     Head: Normocephalic and atraumatic.  Cardiovascular:     Pulses: Normal pulses.  Pulmonary:     Effort: Pulmonary effort is normal.  Musculoskeletal:        General:  No swelling, tenderness or deformity. Normal range of motion.     Right knee: Normal. No bony tenderness or crepitus. Normal range of motion. No tenderness. No LCL laxity, MCL laxity, ACL laxity or PCL laxity. Normal alignment, normal meniscus and normal patellar mobility. Normal pulse.     Instability Tests: Negative medial McMurray test and negative lateral McMurray test.     Left knee: Normal.  Skin:    General: Skin is warm and dry.     Findings: No erythema or rash.  Neurological:     Mental Status: She is alert and oriented to person, place, and time.     Sensory: No sensory deficit.  Psychiatric:        Behavior: Behavior normal.     ED Results / Procedures / Treatments   Labs (all labs ordered are listed, but only abnormal results are displayed) Labs Reviewed - No data to display  EKG None  Radiology No results found.  Procedures Procedures (including critical care time)  Medications Ordered in ED Medications - No data to display  ED Course  I have reviewed the triage vital signs and the nursing notes.  Pertinent labs &  imaging results that were available during my care of the patient were reviewed by me and considered in my medical decision making (see chart for details).  Clinical Course as of Mar 14 1241  Sat Mar 14, 6474  1253 19 year old female with right knee pain after attempting to kick something this morning, did not make contact and fell.  Patient is ambulatory without difficulty, states pain feels like it is deep in her knee.  There is no pain with palpation or range of motion, there is no ligamentous laxity or crepitus.  Offered reassurance, advised she can take Motrin Tylenol.  Patient asked about wrapping her knee, patient she could use a knee sleeve or an Ace if she desired however this may cause a stiffness and was not recommended.  Commend recheck with PCP if pain persists.   [LM]    Clinical Course User Index [LM] Roque Lias   MDM Rules/Calculators/A&P                      Final Clinical Impression(s) / ED Diagnoses Final diagnoses:  Strain of right knee, initial encounter    Rx / DC Orders ED Discharge Orders    None       Roque Lias 03/14/20 1242    Carmin Muskrat, MD 03/14/20 1530

## 2020-03-14 NOTE — Discharge Instructions (Addendum)
Take Motrin and Tylenol as needed as directed.  Apply ice for 20 minutes at a time as needed.  You can wrap your knee with a an Ace wrap or knee sleeve if desired however this may cause more problems such as knee stiffness.

## 2020-03-14 NOTE — ED Triage Notes (Signed)
Pt on video call when RN entered triage room and continued to talk to person on video call.  Asked pt to end call so that I could triage her and she began cussing and telling RN there are no signs posted.    Reports R knee pain after kicking something last night.  Pt giving limited information.

## 2020-08-03 IMAGING — US US PELVIS COMPLETE
1 series · 13 of 25 positions shown · non-contrast
Comparison: None.

CLINICAL DATA: Pelvic pain and vaginal bleeding for 3 days.

EXAM:
TRANSABDOMINAL AND TRANSVAGINAL ULTRASOUND OF PELVIS
DOPPLER ULTRASOUND OF OVARIES
TECHNIQUE: Both transabdominal and transvaginal ultrasound examinations of the
pelvis were performed. Transabdominal technique was performed for
global imaging of the pelvis including uterus, ovaries, adnexal
regions, and pelvic cul-de-sac.
It was necessary to proceed with endovaginal exam following the
transabdominal exam to visualize the endometrium and ovaries. Color
and duplex Doppler ultrasound was utilized to evaluate blood flow to
the ovaries.

[Series 1: us pelvis complete · 0.22mm/px · 13 of 120 slices shown]
[im 1/120]
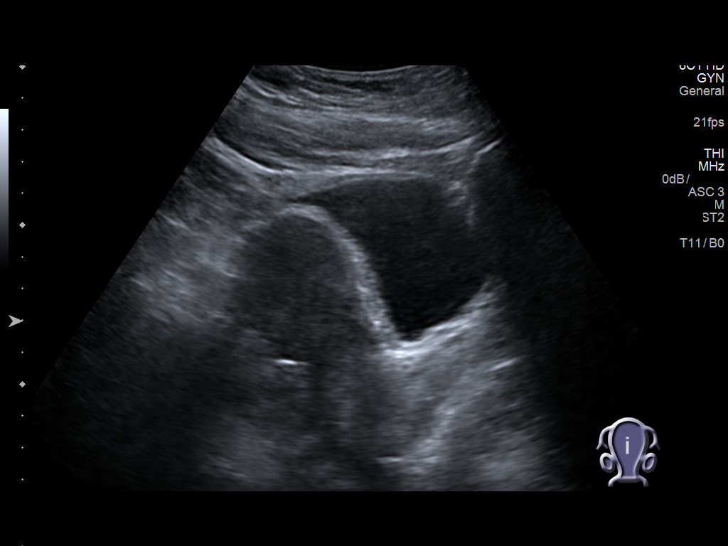
[im 10/120]
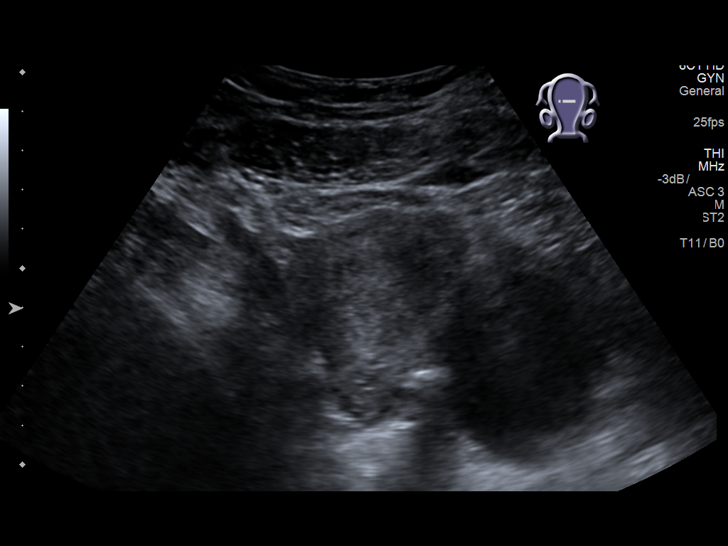
[im 20/120]
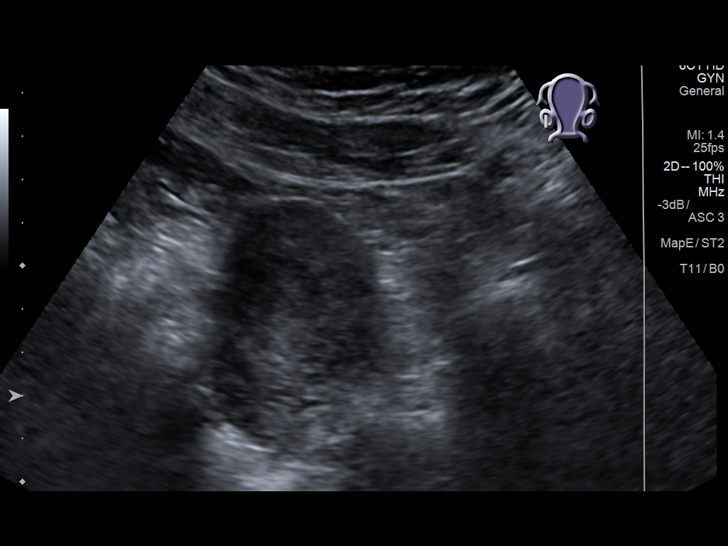
[im 30/120]
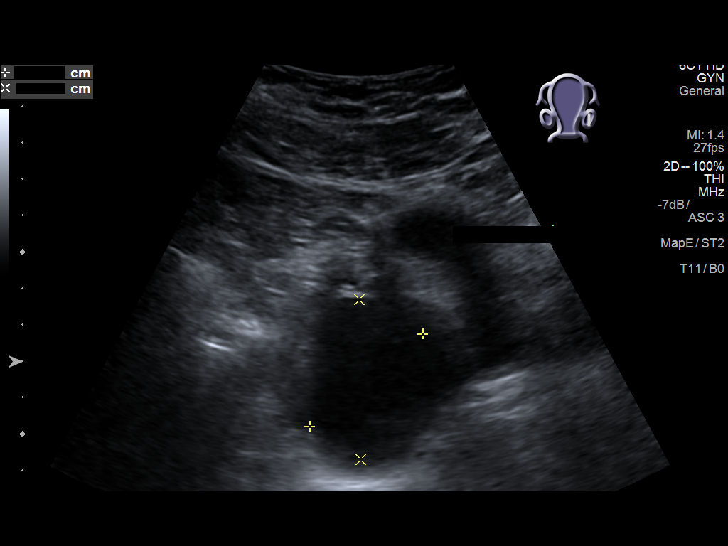
[im 40/120]
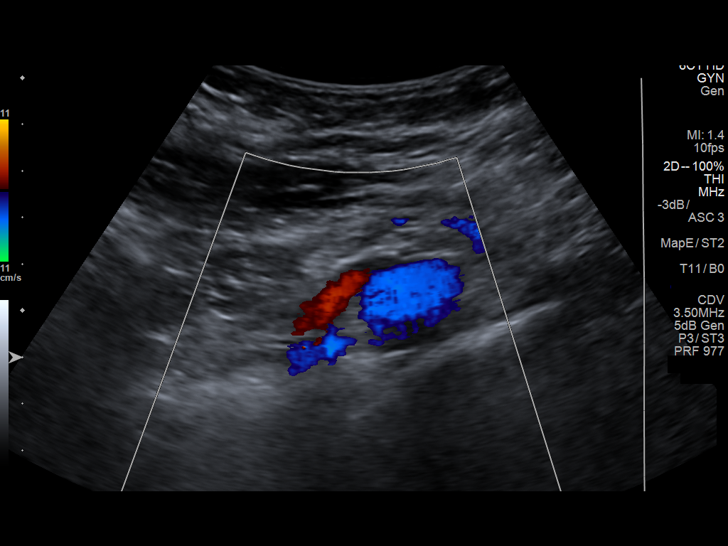
[im 50/120]
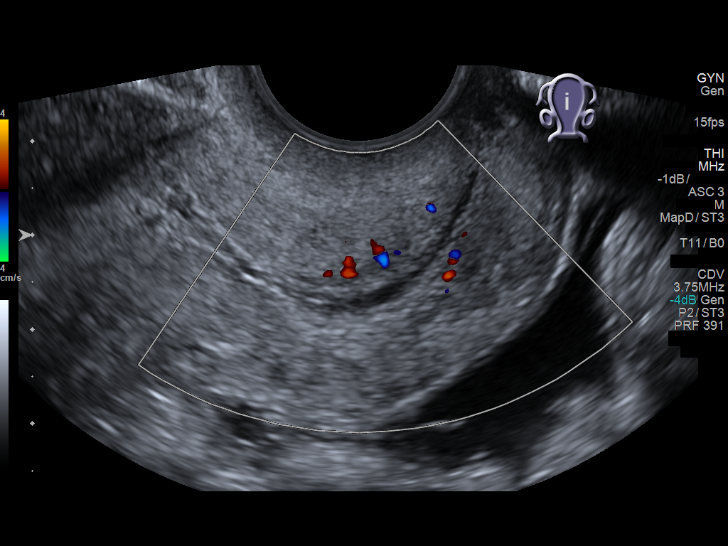
[im 60/120]
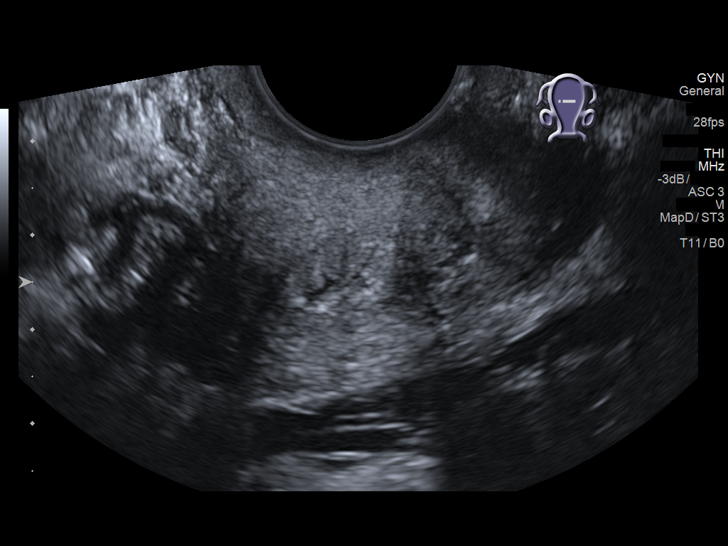
[im 70/120]
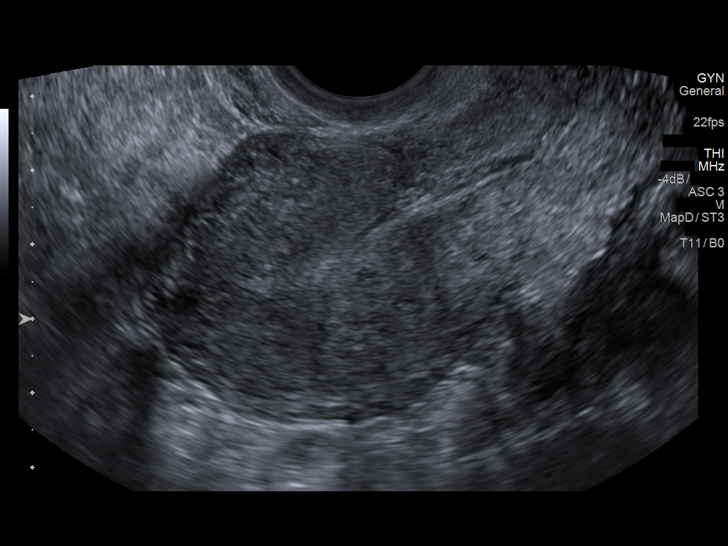
[im 80/120]
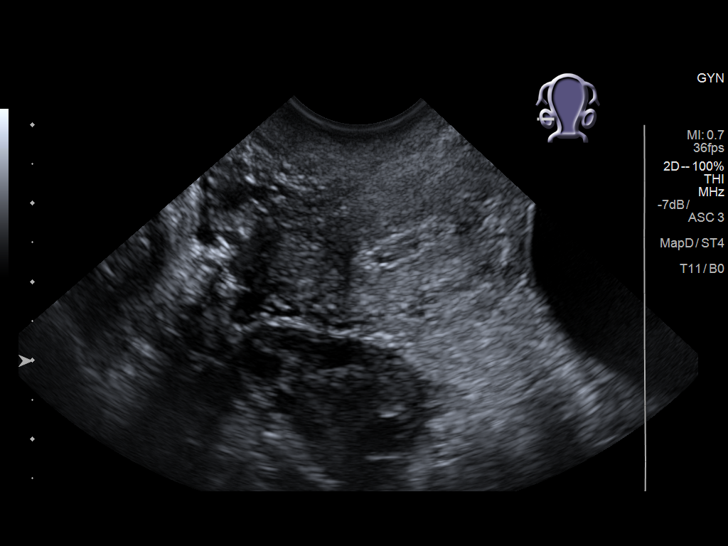
[im 90/120]
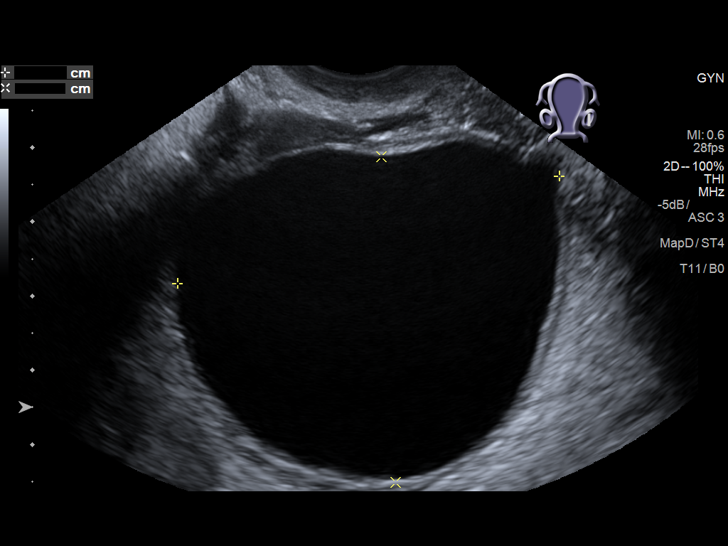
[im 100/120]
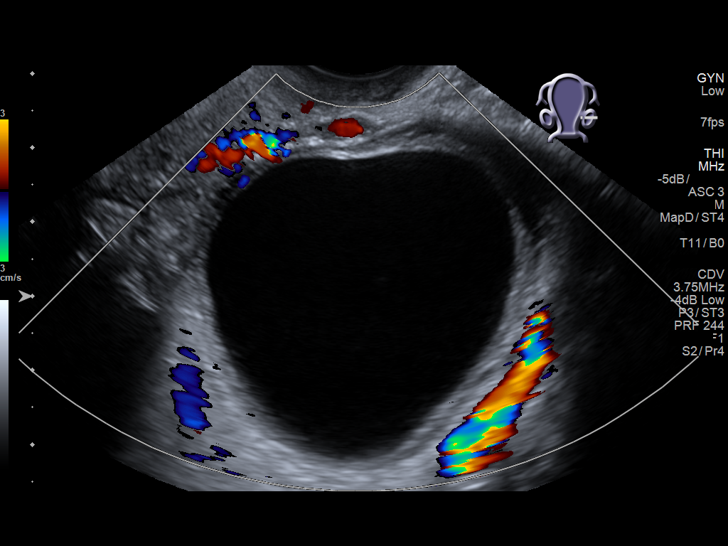
[im 110/120]
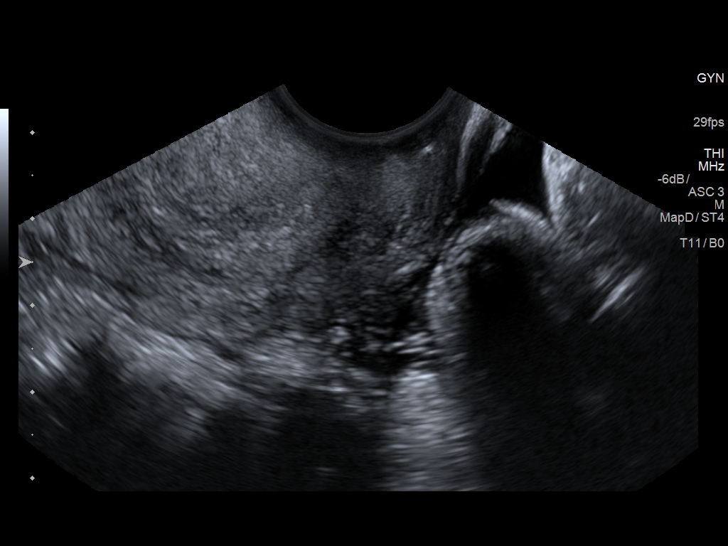
[im 120/120]
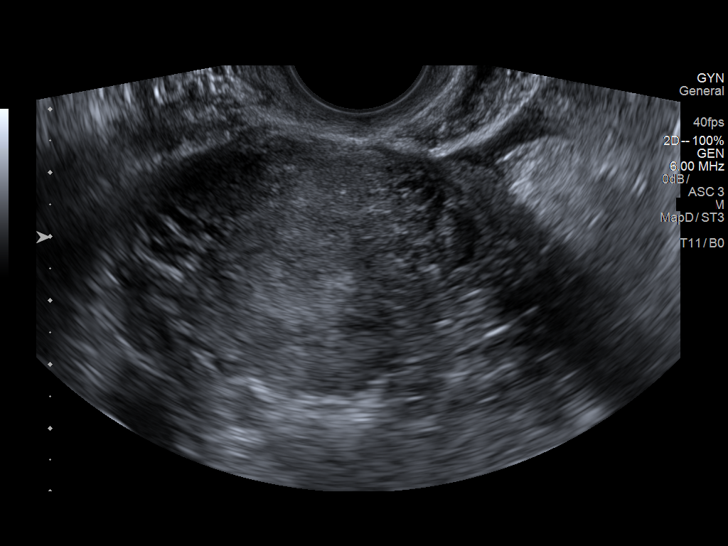

[13 of 25 positions shown; findings below may reference images not displayed]

FINDINGS: Uterus

Measurements: 8.6 x 4.0 x 3.7 cm = volume: 66 mL. No fibroids or
other mass visualized.

Endometrium

Thickness: 6 mm.  No focal abnormality visualized.

Right ovary

Measurements: 2.5 x 1.7 x 2.1 cm = volume: 4.8 mL. Normal
appearance/no adnexal mass.

Left ovary

Measurements: 6.0 x 4.9 x 5.2 cm = volume: 79.8 mL. A simple cyst is
seen which measures 5.3 x 4.4 x 4.5 cm, and has benign
characteristics.

Pulsed Doppler evaluation of both ovaries demonstrates normal
low-resistance arterial and venous waveforms.

Other findings

No abnormal free fluid.
IMPRESSION: 5.3 cm benign-appearing left ovarian cyst. No imaging follow up is
required for premenopausal females. This follows consensus
guidelines: Simple Adnexal Cysts: SRU Consensus Conference Update on
Follow-up and Reporting. Radiology 2729; [DATE].

Normal appearance of uterus and right ovary.

No sonographic evidence for ovarian torsion.

## 2020-11-23 ENCOUNTER — Other Ambulatory Visit: Payer: Self-pay

## 2020-11-23 ENCOUNTER — Ambulatory Visit (HOSPITAL_COMMUNITY)
Admission: EM | Admit: 2020-11-23 | Discharge: 2020-11-23 | Disposition: A | Discharge status: Home / Self Care | Payer: Medicaid Other | Attending: Internal Medicine | Admitting: Internal Medicine

## 2020-11-23 ENCOUNTER — Encounter (HOSPITAL_COMMUNITY): Payer: Self-pay

## 2020-11-23 DIAGNOSIS — N760 Acute vaginitis: Secondary | ICD-10-CM | POA: Insufficient documentation

## 2020-11-23 LAB — HIV ANTIBODY (ROUTINE TESTING W REFLEX): HIV Screen 4th Generation wRfx: NONREACTIVE

## 2020-11-23 LAB — RPR: RPR Ser Ql: NONREACTIVE

## 2020-11-23 NOTE — Discharge Instructions (Signed)
We will call you with results if abnormal.

## 2020-11-23 NOTE — ED Triage Notes (Signed)
Pt in with c/o vaginal discharge that has been going on for a weeks   Denies vaginal itching or burning

## 2020-11-24 ENCOUNTER — Telehealth (HOSPITAL_COMMUNITY): Payer: Self-pay | Admitting: Emergency Medicine

## 2020-11-24 LAB — CERVICOVAGINAL ANCILLARY ONLY
Bacterial Vaginitis (gardnerella): POSITIVE — AB
Candida Glabrata: NEGATIVE
Candida Vaginitis: NEGATIVE
Chlamydia: NEGATIVE
Comment: NEGATIVE
Comment: NEGATIVE
Comment: NEGATIVE
Comment: NEGATIVE
Comment: NEGATIVE
Comment: NORMAL
Neisseria Gonorrhea: NEGATIVE
Trichomonas: NEGATIVE

## 2020-11-24 MED ORDER — METRONIDAZOLE 500 MG PO TABS: 500.0000 mg | ORAL_TABLET | Freq: Two times a day (BID) | ORAL | 0 refills | Status: AC

## 2020-11-26 NOTE — ED Provider Notes (Signed)
[facility]    CSN: [id number] Arrival date & time: 11/23/20  0809      History   Chief Complaint Chief Complaint  Patient presents with  . Vaginal Discharge    HPI Sarah Bell is a 20 y.o. female comes to the urgent care with vaginal discharge of about 1 week duration.  Discharge is malodorous and without itching.  It is copious.  No abdominal pain.  No dysuria urgency or frequency.  Patient is sexually active and engaged in unprotected sexual intercourse.  She would like to be screened for STD.  No vaginal itching or burning.  No vaginal rash.   HPI  Past Medical History:  Diagnosis Date  . Medical history non-contributory     Patient Active Problem List   Diagnosis Date Noted  . Asymptomatic bacteriuria in pregnancy in second trimester 02/24/2014  . Susceptible to varicella (non-immune), currently pregnant 01/28/2014  . Supervision of normal first teen pregnancy 01/27/2014    Past Surgical History:  Procedure Laterality Date  . NO PAST SURGERIES      OB History    Gravida  1   Para      Term      Preterm      AB      Living        SAB      IAB      Ectopic      Multiple      Live Births               Home Medications    Prior to Admission medications   Medication Sig Start Date End Date Taking? Authorizing Provider  metroNIDAZOLE (FLAGYL) 500 MG tablet Take 1 tablet (500 mg total) by mouth 2 (two) times daily. 11/24/20   LampteyBritta Mccreedy, MD    Family History Family History  Problem Relation Age of Onset  . Hypertension Mother   . Cancer Paternal Aunt        pancreatic  . Diabetes Paternal Grandmother   . Hypertension Paternal Grandmother   . Stroke Paternal Grandfather   . Cancer Other        breast-paternal great grandma  . Kidney disease Other        paternal great grandma  . Healthy Father     Social History Social History   Tobacco Use  . Smoking status: Current Every Day Smoker    Types: Cigarettes   . Smokeless tobacco: Never Used  Substance Use Topics  . Alcohol use: No  . Drug use: No    Types: Marijuana    Comment: not now     Allergies   Patient has no known allergies.   Review of Systems Review of Systems  Gastrointestinal: Negative for abdominal pain.  Genitourinary: Positive for vaginal discharge. Negative for dysuria, frequency, vaginal bleeding and vaginal pain.  Neurological: Negative.      Physical Exam Triage Vital Signs ED Triage Vitals  Enc Vitals Group     BP 11/23/20 0829 132/76     Pulse Rate 11/23/20 0829 65     Resp 11/23/20 0829 17     Temp 11/23/20 0829 98.7 F (37.1 C)     Temp src --      SpO2 11/23/20 0829 99 %     Weight --      Height --      Head Circumference --      Peak Flow --  Pain Score 11/23/20 0827 0     Pain Loc --      Pain Edu? --      Excl. in GC? --    No data found.  Updated Vital Signs BP 132/76 (BP Location: Right Arm)   Pulse 65   Temp 98.7 F (37.1 C)   Resp 17   LMP 11/18/2020 (Approximate)   SpO2 99%   Breastfeeding No   Visual Acuity Right Eye Distance:   Left Eye Distance:   Bilateral Distance:    Right Eye Near:   Left Eye Near:    Bilateral Near:     Physical Exam Vitals and nursing note reviewed.  Constitutional:      General: She is not in acute distress.    Appearance: She is not ill-appearing.  Cardiovascular:     Rate and Rhythm: Normal rate and regular rhythm.  Pulmonary:     Effort: Pulmonary effort is normal.     Breath sounds: Normal breath sounds.  Abdominal:     General: Bowel sounds are normal.     Palpations: Abdomen is soft.  Neurological:     Mental Status: She is alert.      UC Treatments / Results  Labs (all labs ordered are listed, but only abnormal results are displayed) Labs Reviewed  CERVICOVAGINAL ANCILLARY ONLY - Abnormal; Notable for the following components:      Result Value   Bacterial Vaginitis (gardnerella) Positive (*)    All other  components within normal limits  HIV ANTIBODY (ROUTINE TESTING W REFLEX)  RPR    EKG   Radiology No results found.  Procedures Procedures (including critical care time)  Medications Ordered in UC Medications - No data to display  Initial Impression / Assessment and Plan / UC Course  I have reviewed the triage vital signs and the nursing notes.  Pertinent labs & imaging results that were available during my care of the patient were reviewed by me and considered in my medical decision making (see chart for details).     1.  Acute vaginitis: Cervicovaginal swab for GC/chlamydia/trichomonas/bacterial vaginosis/vaginal yeast HIV, RPR Safe sex practices advised We will wait for results and treat appropriately. Return precautions given. Final Clinical Impressions(s) / UC Diagnoses   Final diagnoses:  Acute vaginitis     Discharge Instructions     We will call you with results if abnormal.   ED Prescriptions    None     PDMP not reviewed this encounter.   Merrilee Jansky, MD 11/26/20 [phone]

## 2021-04-17 ENCOUNTER — Emergency Department (HOSPITAL_COMMUNITY)
Admission: EM | Admit: 2021-04-17 | Discharge: 2021-04-18 | Disposition: A | Discharge status: Home / Self Care | Payer: Medicaid Other | Attending: Emergency Medicine | Admitting: Emergency Medicine

## 2021-04-17 ENCOUNTER — Encounter (HOSPITAL_COMMUNITY): Payer: Self-pay

## 2021-04-17 DIAGNOSIS — R0789 Other chest pain: Secondary | ICD-10-CM | POA: Diagnosis not present

## 2021-04-17 DIAGNOSIS — Z5321 Procedure and treatment not carried out due to patient leaving prior to being seen by health care provider: Secondary | ICD-10-CM | POA: Diagnosis not present

## 2021-04-17 DIAGNOSIS — R079 Chest pain, unspecified: Secondary | ICD-10-CM | POA: Insufficient documentation

## 2021-04-17 LAB — CBC WITH DIFFERENTIAL/PLATELET
Abs Immature Granulocytes: 0.03 10*3/uL (ref 0.00–0.07)
Basophils Absolute: 0 10*3/uL (ref 0.0–0.1)
Basophils Relative: 0 %
Eosinophils Absolute: 0.2 10*3/uL (ref 0.0–0.5)
Eosinophils Relative: 3 %
HCT: 40.2 % (ref 36.0–46.0)
Hemoglobin: 13.2 g/dL (ref 12.0–15.0)
Immature Granulocytes: 0 %
Lymphocytes Relative: 54 %
Lymphs Abs: 3.7 10*3/uL (ref 0.7–4.0)
MCH: 30.7 pg (ref 26.0–34.0)
MCHC: 32.8 g/dL (ref 30.0–36.0)
MCV: 93.5 fL (ref 80.0–100.0)
Monocytes Absolute: 0.7 10*3/uL (ref 0.1–1.0)
Monocytes Relative: 10 %
Neutro Abs: 2.3 10*3/uL (ref 1.7–7.7)
Neutrophils Relative %: 33 %
Platelets: 261 10*3/uL (ref 150–400)
RBC: 4.3 MIL/uL (ref 3.87–5.11)
RDW: 12.3 % (ref 11.5–15.5)
WBC: 7 10*3/uL (ref 4.0–10.5)
nRBC: 0 % (ref 0.0–0.2)

## 2021-04-17 LAB — BASIC METABOLIC PANEL
Anion gap: 7 (ref 5–15)
BUN: 10 mg/dL (ref 6–20)
CO2: 28 mmol/L (ref 22–32)
Calcium: 9.4 mg/dL (ref 8.9–10.3)
Chloride: 103 mmol/L (ref 98–111)
Creatinine, Ser: 0.96 mg/dL (ref 0.44–1.00)
GFR, Estimated: >60 mL/min (ref >60)
Glucose, Bld: 89 mg/dL (ref 70–99)
Potassium: 3.8 mmol/L (ref 3.5–5.1)
Sodium: 138 mmol/L (ref 135–145)

## 2021-04-17 LAB — I-STAT BETA HCG BLOOD, ED (MC, WL, AP ONLY): I-stat hCG, quantitative: <5 m[IU]/mL (ref <5)

## 2021-04-17 NOTE — ED Triage Notes (Signed)
Pt states that she has been having CP that has been going on for months, central, denies other cardiac symptoms.

## 2021-04-17 NOTE — ED Provider Notes (Signed)
Emergency Medicine Provider Triage Evaluation Note  Sarah Bell , a 20 y.o. female  was evaluated in triage.  Pt complains of central chest pain x multiple months, worsening today. Pain is intermittent and sharp. Worse with movement.  No SOB, n/v, diaphoresis. No hx blood clot, no OCPs, no immobilization, hx cancer, no recent trauma. No LE edema.   Review of Systems  Positive: CP Negative: SOB  Physical Exam  BP (!) 136/97 (BP Location: Right Arm)   Pulse 89   Temp 98.4 F (36.9 C) (Oral)   Resp 18   SpO2 99%  Gen:   Awake, no distress   Resp:  Normal effort  MSK:   Moves extremities without difficulty  Other:  Lungs clear, heart sounds normal  Medical Decision Making  Medically screening exam initiated at 9:30 PM.  Appropriate orders placed.  Sarah Bell was informed that the remainder of the evaluation will be completed by another provider, this initial triage assessment does not replace that evaluation, and the importance of remaining in the ED until their evaluation is complete.     Renlee Floor, [place] N, PA-C 04/17/21 2132    Arby Barrette, MD 04/22/21 [phone]

## 2021-04-18 NOTE — ED Notes (Signed)
Patient called x2 for vitals with no response 

## 2021-04-18 NOTE — ED Notes (Signed)
Patient called for vitals x1 with no response 

## 2021-05-29 ENCOUNTER — Other Ambulatory Visit: Payer: Self-pay

## 2021-05-29 ENCOUNTER — Encounter (HOSPITAL_COMMUNITY): Payer: Self-pay

## 2021-05-29 ENCOUNTER — Emergency Department (HOSPITAL_COMMUNITY)
Admission: EM | Admit: 2021-05-29 | Discharge: 2021-05-29 | Disposition: A | Discharge status: Home / Self Care | Payer: Medicaid Other | Attending: Emergency Medicine | Admitting: Emergency Medicine

## 2021-05-29 DIAGNOSIS — F17210 Nicotine dependence, cigarettes, uncomplicated: Secondary | ICD-10-CM | POA: Insufficient documentation

## 2021-05-29 DIAGNOSIS — Z20822 Contact with and (suspected) exposure to covid-19: Secondary | ICD-10-CM | POA: Diagnosis not present

## 2021-05-29 DIAGNOSIS — J029 Acute pharyngitis, unspecified: Secondary | ICD-10-CM | POA: Insufficient documentation

## 2021-05-29 LAB — GROUP A STREP BY PCR: Group A Strep by PCR: NOT DETECTED

## 2021-05-29 LAB — RESP PANEL BY RT-PCR (FLU A&B, COVID) ARPGX2
Influenza A by PCR: NEGATIVE
Influenza B by PCR: NEGATIVE
SARS Coronavirus 2 by RT PCR: NEGATIVE

## 2021-05-29 MED ORDER — LIDOCAINE VISCOUS HCL 2 % MT SOLN: 15.0000 mL | OROMUCOSAL | 0 refills | Status: AC | PRN

## 2021-05-29 MED ORDER — IBUPROFEN 800 MG PO TABS
800.0000 mg | ORAL_TABLET | Freq: Once | ORAL | Status: AC
  Administered 2021-05-29: 800 mg via ORAL
  Filled 2021-05-29: qty 1

## 2021-05-29 MED ORDER — MELOXICAM 7.5 MG PO TABS: 7.5000 mg | ORAL_TABLET | Freq: Two times a day (BID) | ORAL | 0 refills | Status: AC | PRN

## 2021-05-29 NOTE — Discharge Instructions (Addendum)
Please take Mobic - 7.5mg  by mouth twice daily as needed for pain - this in an antiinflammatory medicine (NSAID) and is similar to ibuprofen - many people feel that it is stronger than ibuprofen and it is easier to take since it is a smaller pill.  Please use this only for 1 week - if your pain persists, you will need to follow up with your doctor in the office for ongoing guidance and pain control.    Lidocaine, this is a thickened liquid that you can gargle and swallow which will help with sore throat.  Please use this every 6 hours as needed.  Thank you for letting us take care of you today!  Please obtain all of your results from medical records or have your doctors office obtain the results - share them with your doctor - you should be seen at your doctors office in the next 2 days. Call today to arrange your follow up. Take the medications as prescribed. Please review all of the medicines and only take them if you do not have an allergy to them. Please be aware that if you are taking birth control pills, taking other prescriptions, ESPECIALLY ANTIBIOTICS may make the birth control ineffective - if this is the case, either do not engage in sexual activity or use alternative methods of birth control such as condoms until you have finished the medicine and your family doctor says it is OK to restart them. If you are on a blood thinner such as COUMADIN, be aware that any other medicine that you take may cause the coumadin to either work too much, or not enough - you should have your coumadin level rechecked in next 7 days if this is the case.  ?  It is also a possibility that you have an allergic reaction to any of the medicines that you have been prescribed - Everybody reacts differently to medications and while MOST people have no trouble with most medicines, you may have a reaction such as nausea, vomiting, rash, swelling, shortness of breath. If this is the case, please stop taking the medicine  immediately and contact your physician.   If you were given a medication in the ED such as percocet, vicodin, or morphine, be aware that these medicines are sedating and may change your ability to take care of yourself adequately for several hours after being given this medicines - you should not drive or take care of small children if you were given this medicine in the Emergency Department or if you have been prescribed these types of medicines. ?   You should return to the ER IMMEDIATELY if you develop severe or worsening symptoms.    If your COVID test is positive you will receive a phone call, you should quarantine for a total of 5 days, if your fever and cough is getting better you may come out of quarantine but must wear a mask for another 5 days.  Please read the attached instructions

## 2021-05-29 NOTE — ED Triage Notes (Signed)
Pt presents to ED with complaints of sore throat, body aches and chills x 3 days.

## 2021-05-29 NOTE — ED Provider Notes (Signed)
[facility] EMERGENCY DEPARTMENT Provider Note   CSN: [id number] Arrival date & time: 05/29/21  4403     History Chief Complaint  Patient presents with   Sore Throat    Sarah Bell is a 20 y.o. female.   Sore Throat   This patient is a 20 year old female presenting after being exposed to COVID by her aunt, her cousin who his sick and now her sister who is sick, she has had sore throat, myalgias fevers and chills for the last 3 days she has not been tested for COVID, she does have an associated cough which is mild.  She has no nausea vomiting or diarrhea, she has taken some over-the-counter medications with minimal relief, she has no urinary symptoms, symptoms of been persistent over the last 3 days, she feels like it is worse than strep throat.  Past Medical History:  Diagnosis Date   Medical history non-contributory     Patient Active Problem List   Diagnosis Date Noted   Asymptomatic bacteriuria in pregnancy in second trimester 02/24/2014   Susceptible to varicella (non-immune), currently pregnant 01/28/2014   Supervision of normal first teen pregnancy 01/27/2014    Past Surgical History:  Procedure Laterality Date   NO PAST SURGERIES       OB History     Gravida  1   Para      Term      Preterm      AB      Living         SAB      IAB      Ectopic      Multiple      Live Births              Family History  Problem Relation Age of Onset   Hypertension Mother    Cancer Paternal Aunt        pancreatic   Diabetes Paternal Grandmother    Hypertension Paternal Grandmother    Stroke Paternal Grandfather    Cancer Other        breast-paternal great grandma   Kidney disease Other        paternal great grandma   Healthy Father     Social History   Tobacco Use   Smoking status: Every Day    Types: Cigarettes   Smokeless tobacco: Never  Substance Use Topics   Alcohol use: No   Drug use: No    Types: Marijuana    Comment: not  now    Home Medications Prior to Admission medications   Medication Sig Start Date End Date Taking? Authorizing Provider  lidocaine (XYLOCAINE) 2 % solution Use as directed 15 mLs in the mouth or throat every 4 (four) hours as needed for mouth pain. 05/29/21  Yes Eber Hong, MD  meloxicam (MOBIC) 7.5 MG tablet Take 1 tablet (7.5 mg total) by mouth 2 (two) times daily as needed for up to 14 days for pain. 05/29/21 06/12/21 Yes Eber Hong, MD  metroNIDAZOLE (FLAGYL) 500 MG tablet Take 1 tablet (500 mg total) by mouth 2 (two) times daily. 11/24/20   Lamptey, Britta Mccreedy, MD    Allergies    Patient has no known allergies.  Review of Systems   Review of Systems  Constitutional:  Positive for fever.  HENT:  Positive for sore throat.   Respiratory:  Positive for cough.    Physical Exam Updated Vital Signs BP (!) 143/82 (BP Location: Right Arm)   Pulse 90  Temp 98.8 F (37.1 C) (Oral)   Resp 18   Ht 1.575 m (5\' 2" )   Wt 86.2 kg   LMP 04/24/2021   SpO2 99%   BMI 34.75 kg/m   Physical Exam Vitals and nursing note reviewed.  Constitutional:      Appearance: She is well-developed. She is not diaphoretic.  HENT:     Head: Normocephalic and atraumatic.     Nose: Nose normal. No congestion.     Mouth/Throat:     Mouth: Mucous membranes are moist.     Pharynx: Posterior oropharyngeal erythema present. No oropharyngeal exudate.     Tonsils: No tonsillar exudate or tonsillar abscesses.  Eyes:     General:        Right eye: No discharge.        Left eye: No discharge.     Conjunctiva/sclera: Conjunctivae normal.     Pupils: Pupils are equal, round, and reactive to light.  Cardiovascular:     Rate and Rhythm: Normal rate.     Pulses: Normal pulses.  Pulmonary:     Effort: Pulmonary effort is normal. No respiratory distress.     Breath sounds: No wheezing.  Skin:    General: Skin is warm and dry.     Findings: No erythema or rash.  Neurological:     Mental Status: She is alert.      Coordination: Coordination normal.    ED Results / Procedures / Treatments   Labs (all labs ordered are listed, but only abnormal results are displayed) Labs Reviewed  GROUP A STREP BY PCR  RESP PANEL BY RT-PCR (FLU A&B, COVID) [id number]    EKG None  Radiology No results found.  Procedures Procedures   Medications Ordered in ED Medications  ibuprofen (ADVIL) tablet 800 mg (has no administration in time range)    ED Course  I have reviewed the triage vital signs and the nursing notes.  Pertinent labs & imaging results that were available during my care of the patient were reviewed by me and considered in my medical decision making (see chart for details).    MDM Rules/Calculators/A&P                           The patient has a very supple neck, minimal lymphadenopathy of the anterior cervical chain, no trismus or torticollis, red tonsils without exudate, overall the patient has a picture of a viral illness but will rule out strep throat, test for COVID and flu.  Sarah Bell was evaluated in Emergency Department on 05/29/2021 for the symptoms described in the history of present illness. She was evaluated in the context of the global COVID-19 pandemic, which necessitated consideration that the patient might be at risk for infection with the SARS-CoV-2 virus that causes COVID-19. Institutional protocols and algorithms that pertain to the evaluation of patients at risk for COVID-19 are in a state of rapid change based on information released by regulatory bodies including the CDC and federal and state organizations. These policies and algorithms were followed during the patient's care in the ED.  Strep negative, COVID pending, patient stable for discharge, vital signs unremarkable   Final Clinical Impression(s) / ED Diagnoses Final diagnoses:  Acute pharyngitis, unspecified etiology  Close exposure to COVID-19 virus    Rx / DC Orders ED Discharge Orders           Ordered    meloxicam (MOBIC) 7.5 MG tablet  2  times daily PRN        05/29/21 0839    lidocaine (XYLOCAINE) 2 % solution  Every 4 hours PRN        05/29/21 0839             Eber Hong, MD 05/29/21 [phone]

## 2021-10-08 DIAGNOSIS — Z1388 Encounter for screening for disorder due to exposure to contaminants: Secondary | ICD-10-CM | POA: Diagnosis not present

## 2021-10-08 DIAGNOSIS — Z114 Encounter for screening for human immunodeficiency virus [HIV]: Secondary | ICD-10-CM | POA: Diagnosis not present

## 2021-10-08 DIAGNOSIS — Z733 Stress, not elsewhere classified: Secondary | ICD-10-CM | POA: Diagnosis not present

## 2021-10-08 DIAGNOSIS — N760 Acute vaginitis: Secondary | ICD-10-CM | POA: Diagnosis not present

## 2021-10-08 DIAGNOSIS — Z0389 Encounter for observation for other suspected diseases and conditions ruled out: Secondary | ICD-10-CM | POA: Diagnosis not present

## 2021-10-08 DIAGNOSIS — Z113 Encounter for screening for infections with a predominantly sexual mode of transmission: Secondary | ICD-10-CM | POA: Diagnosis not present

## 2021-10-08 DIAGNOSIS — Z3009 Encounter for other general counseling and advice on contraception: Secondary | ICD-10-CM | POA: Diagnosis not present

## 2022-07-18 DIAGNOSIS — Z01419 Encounter for gynecological examination (general) (routine) without abnormal findings: Secondary | ICD-10-CM | POA: Diagnosis not present

## 2022-07-18 DIAGNOSIS — Z0389 Encounter for observation for other suspected diseases and conditions ruled out: Secondary | ICD-10-CM | POA: Diagnosis not present

## 2022-10-11 DIAGNOSIS — Z3202 Encounter for pregnancy test, result negative: Secondary | ICD-10-CM | POA: Diagnosis not present

## 2022-10-11 DIAGNOSIS — Z6836 Body mass index (BMI) 36.0-36.9, adult: Secondary | ICD-10-CM | POA: Diagnosis not present

## 2022-10-11 DIAGNOSIS — Z720 Tobacco use: Secondary | ICD-10-CM | POA: Diagnosis not present

## 2022-10-11 DIAGNOSIS — N915 Oligomenorrhea, unspecified: Secondary | ICD-10-CM | POA: Diagnosis not present

## 2022-10-11 DIAGNOSIS — Z3169 Encounter for other general counseling and advice on procreation: Secondary | ICD-10-CM | POA: Diagnosis not present

## 2022-10-12 DIAGNOSIS — R030 Elevated blood-pressure reading, without diagnosis of hypertension: Secondary | ICD-10-CM | POA: Diagnosis not present

## 2022-10-12 DIAGNOSIS — N915 Oligomenorrhea, unspecified: Secondary | ICD-10-CM | POA: Diagnosis not present

## 2022-10-12 DIAGNOSIS — Z6836 Body mass index (BMI) 36.0-36.9, adult: Secondary | ICD-10-CM | POA: Diagnosis not present
